# Patient Record
Sex: Female | Born: 1980 | Race: White | Hispanic: No | Smoking: Former smoker
Health system: Southern US, Community
[De-identification: ages and names within clinical notes are randomized; demographics above are authoritative.]

## PROBLEM LIST (undated history)

## (undated) DIAGNOSIS — Z87442 Personal history of urinary calculi: Secondary | ICD-10-CM

## (undated) DIAGNOSIS — M459 Ankylosing spondylitis of unspecified sites in spine: Secondary | ICD-10-CM

## (undated) DIAGNOSIS — D649 Anemia, unspecified: Secondary | ICD-10-CM

## (undated) DIAGNOSIS — K8050 Calculus of bile duct without cholangitis or cholecystitis without obstruction: Secondary | ICD-10-CM

## (undated) DIAGNOSIS — F419 Anxiety disorder, unspecified: Secondary | ICD-10-CM

## (undated) DIAGNOSIS — G971 Other reaction to spinal and lumbar puncture: Secondary | ICD-10-CM

## (undated) DIAGNOSIS — F329 Major depressive disorder, single episode, unspecified: Secondary | ICD-10-CM

## (undated) DIAGNOSIS — F32A Depression, unspecified: Secondary | ICD-10-CM

## (undated) HISTORY — PX: WISDOM TOOTH EXTRACTION: SHX21

---
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
## 2005-12-06 ENCOUNTER — Observation Stay: Payer: Self-pay

## 2005-12-15 ENCOUNTER — Inpatient Hospital Stay: Payer: Self-pay | Admitting: Obstetrics & Gynecology

## 2005-12-18 ENCOUNTER — Observation Stay: Payer: Self-pay

## 2006-12-20 ENCOUNTER — Ambulatory Visit: Payer: Self-pay | Admitting: Gynecologic Oncology

## 2008-02-22 ENCOUNTER — Inpatient Hospital Stay: Payer: Self-pay

## 2008-05-10 ENCOUNTER — Ambulatory Visit: Payer: Self-pay | Admitting: Family Medicine

## 2008-08-02 ENCOUNTER — Ambulatory Visit: Payer: Self-pay | Admitting: Family Medicine

## 2008-08-16 ENCOUNTER — Ambulatory Visit: Payer: Self-pay | Admitting: Internal Medicine

## 2008-11-13 ENCOUNTER — Ambulatory Visit: Payer: Self-pay | Admitting: Otolaryngology

## 2010-06-08 IMAGING — US US THYROID
1 series · 17 of 25 positions shown · non-contrast
Comparison: none

REASON FOR EXAM: goiter f/u
COMMENTS:

[Series 1: us thyroid · 17 of 28 slices shown]
[im 1/28]
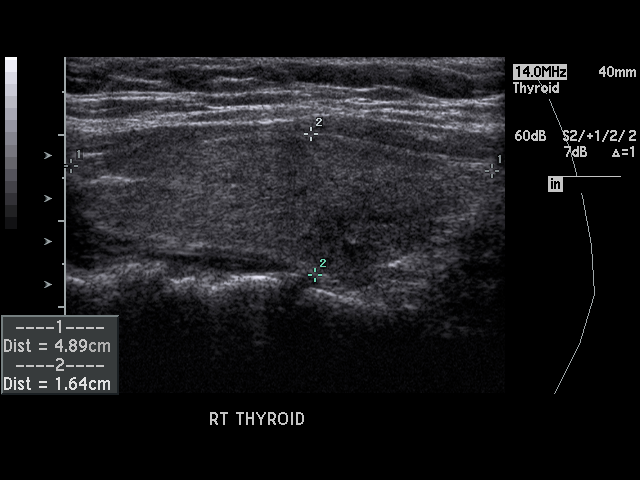
[im 3/28]
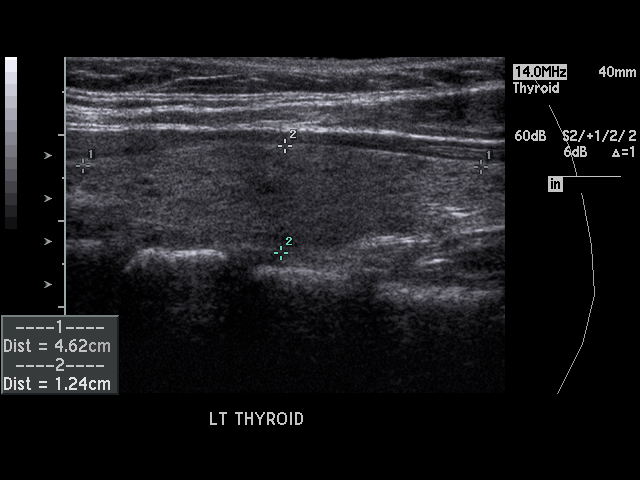
[im 4/28]
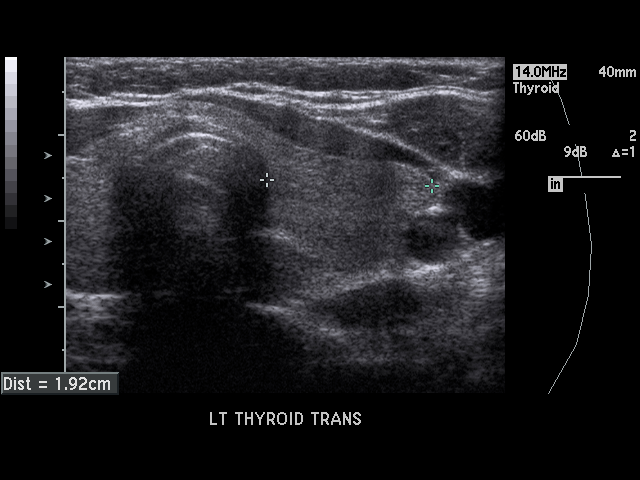
[im 6/28]
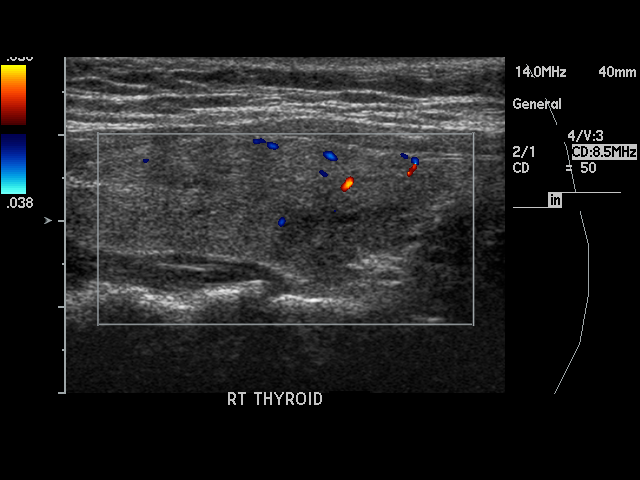
[im 7/28]
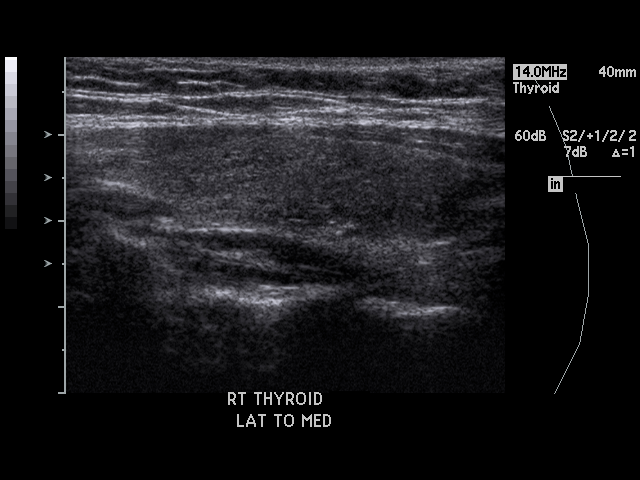
[im 10/28]
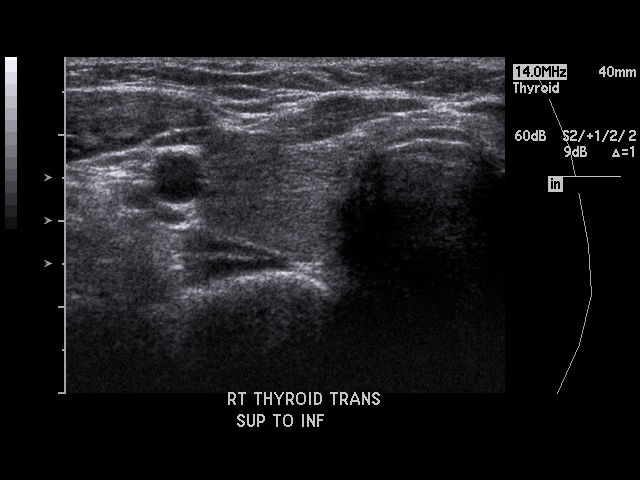
[im 11/28]
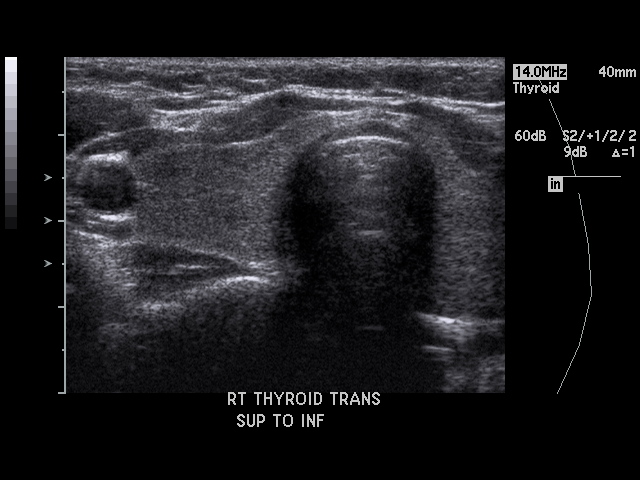
[im 13/28]
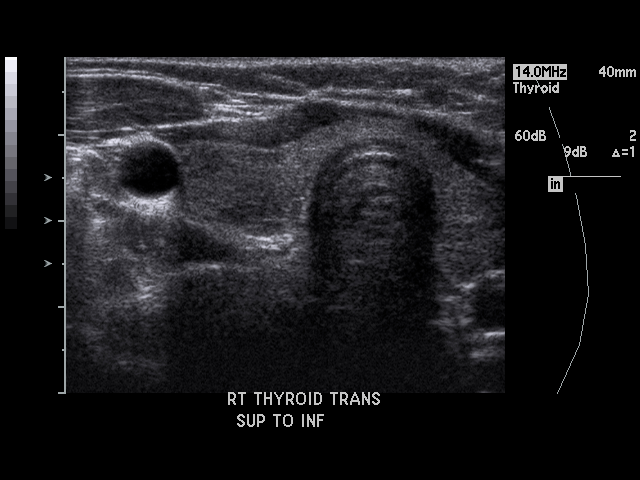
[im 14/28]
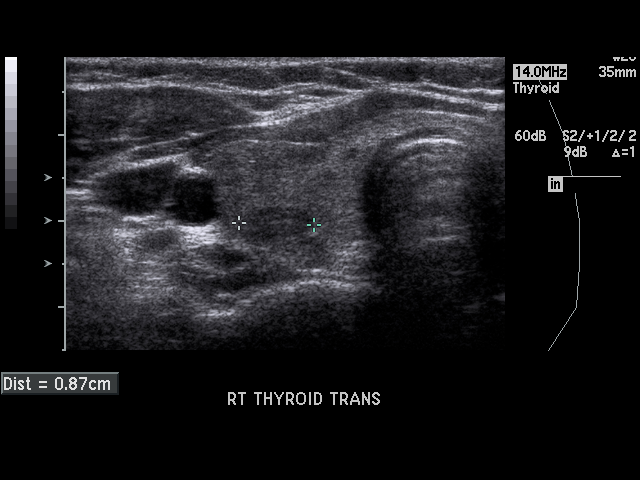
[im 15/28]
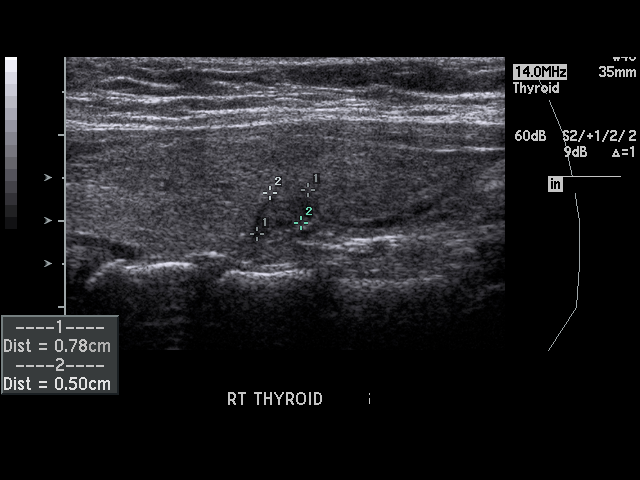
[im 17/28]
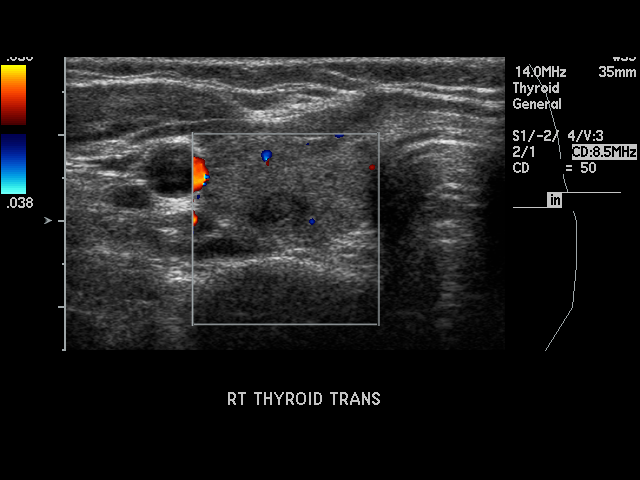
[im 19/28]
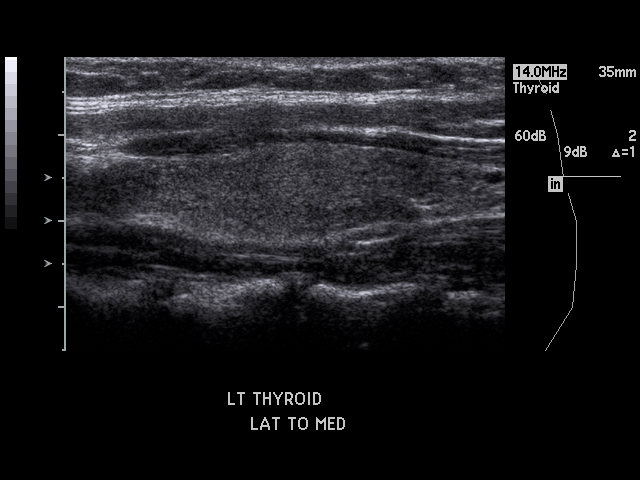
[im 21/28]
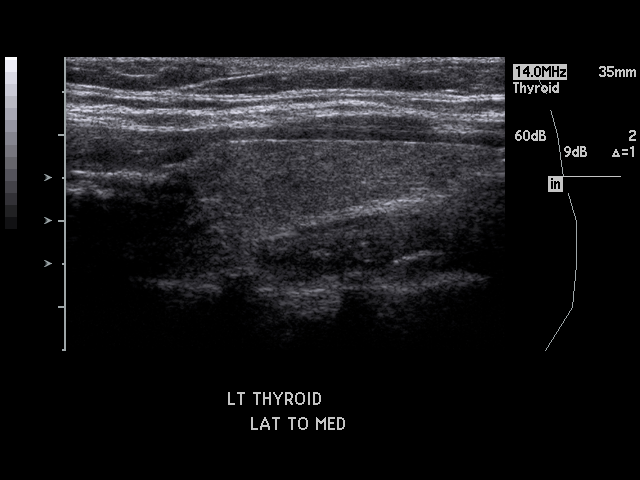
[im 22/28]
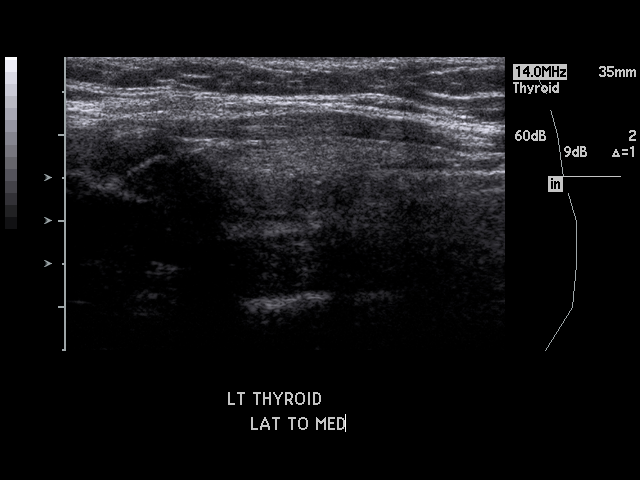
[im 24/28]
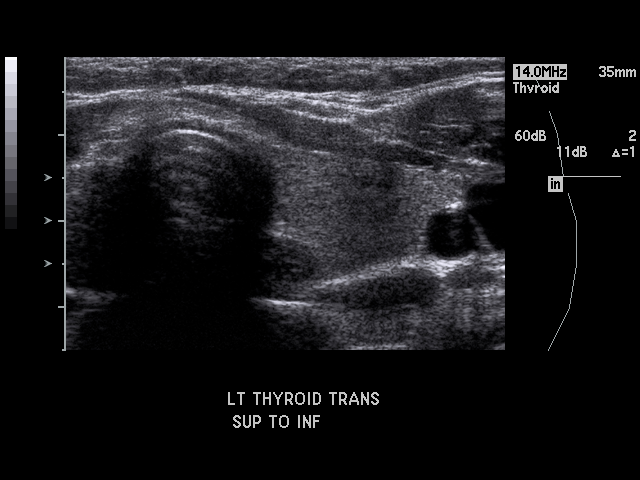
[im 25/28]
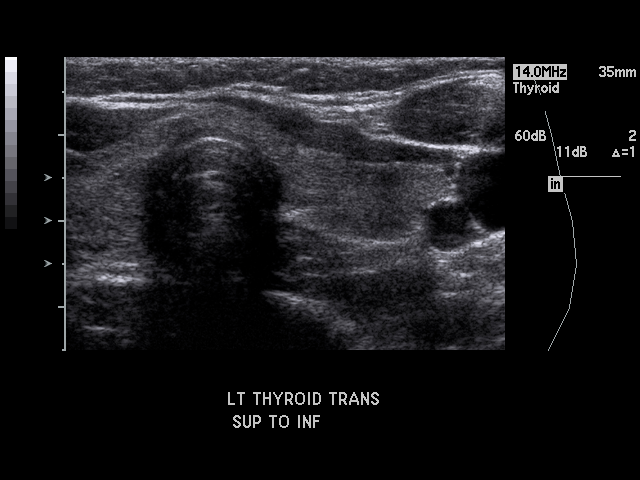
[im 28/28]
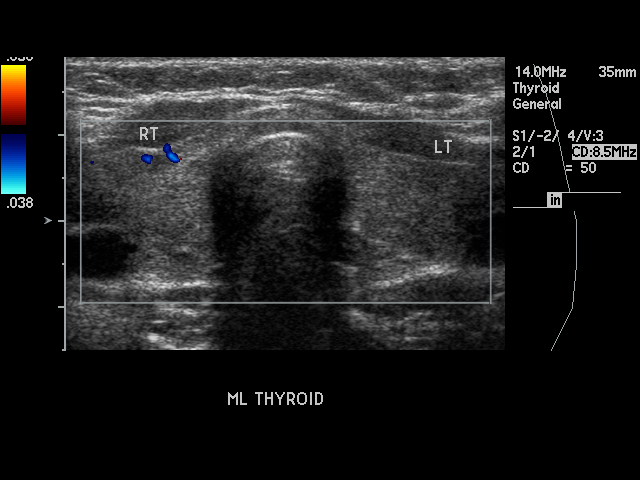

[17 of 25 positions shown; findings below may reference images not displayed]

PROCEDURE:     US  - US THYROID  - August 02, 2008 [DATE]

RESULT:     Ultrasound of the thyroid demonstrates the right lobe measures
4.89 x 1.64 x 1.61 cm. The left lobe measures 4.62 x 1.24 x 1.92 cm. The
right lobe contains a 7.8 x 5.0 x 8.7 mm area of grossly decreased
echogenicity with no calcification or significant shadowing. The left lobe
appears pristine. The isthmus measures 1.61 cm in thickness.
IMPRESSION: Solitary nodular lesion in the right thyroid lobe. There is no evidence of
thyromegaly. No definite microcalcifications are identified. The right lobe
lesion appears to be slightly larger than measured on the previous exam.
Continued followup is suggested in 6 to 12 months.

## 2010-09-29 ENCOUNTER — Ambulatory Visit: Payer: Self-pay

## 2011-06-23 ENCOUNTER — Ambulatory Visit: Payer: Self-pay

## 2012-06-21 HISTORY — PX: GANGLION CYST EXCISION: SHX1691

## 2012-07-28 ENCOUNTER — Ambulatory Visit: Payer: Self-pay | Admitting: General Practice

## 2012-07-31 ENCOUNTER — Emergency Department: Payer: Self-pay | Admitting: Emergency Medicine

## 2012-07-31 LAB — LIPASE, BLOOD: Lipase: 94 U/L (ref 73–393)

## 2012-07-31 LAB — BASIC METABOLIC PANEL
Anion Gap: 9 (ref 7–16)
BUN: 10 mg/dL (ref 7–18)
Creatinine: 0.72 mg/dL (ref 0.60–1.30)
EGFR (African American): >60
EGFR (Non-African Amer.): >60
Glucose: 104 mg/dL — ABNORMAL HIGH (ref 65–99)
Osmolality: 275 (ref 275–301)
Potassium: 4.1 mmol/L (ref 3.5–5.1)
Sodium: 138 mmol/L (ref 136–145)

## 2012-07-31 LAB — URINALYSIS, COMPLETE
Glucose,UR: NEGATIVE mg/dL (ref 0–75)
Nitrite: NEGATIVE
Ph: 5 (ref 4.5–8.0)
Protein: 25
RBC,UR: 2 /HPF (ref 0–5)
Specific Gravity: 1.035 (ref 1.003–1.030)
Squamous Epithelial: 6
WBC UR: 3 /HPF (ref 0–5)

## 2012-07-31 LAB — CBC
HGB: 14.3 g/dL (ref 12.0–16.0)
MCV: 91 fL (ref 80–100)
RBC: 4.64 10*6/uL (ref 3.80–5.20)
RDW: 12.4 % (ref 11.5–14.5)
WBC: 9 10*3/uL (ref 3.6–11.0)

## 2012-07-31 LAB — TROPONIN I: Troponin-I: <0.02 ng/mL

## 2012-08-01 ENCOUNTER — Inpatient Hospital Stay: Payer: Self-pay | Admitting: Internal Medicine

## 2012-08-01 LAB — COMPREHENSIVE METABOLIC PANEL
Albumin: 3.2 g/dL — ABNORMAL LOW (ref 3.4–5.0)
Alkaline Phosphatase: 59 U/L (ref 50–136)
Anion Gap: 9 (ref 7–16)
BUN: 12 mg/dL (ref 7–18)
Bilirubin,Total: 0.4 mg/dL (ref 0.2–1.0)
Calcium, Total: 8.5 mg/dL (ref 8.5–10.1)
Creatinine: 0.67 mg/dL (ref 0.60–1.30)
EGFR (Non-African Amer.): >60
Glucose: 114 mg/dL — ABNORMAL HIGH (ref 65–99)
Potassium: 4.1 mmol/L (ref 3.5–5.1)
SGOT(AST): 22 U/L (ref 15–37)
SGPT (ALT): 22 U/L (ref 12–78)
Total Protein: 7.2 g/dL (ref 6.4–8.2)

## 2012-08-01 LAB — CBC
MCHC: 33.5 g/dL (ref 32.0–36.0)
MCV: 91 fL (ref 80–100)
RBC: 4.43 10*6/uL (ref 3.80–5.20)
RDW: 12.5 % (ref 11.5–14.5)
WBC: 12.8 10*3/uL — ABNORMAL HIGH (ref 3.6–11.0)

## 2012-08-01 LAB — HCG, QUANTITATIVE, PREGNANCY: Beta Hcg, Quant.: <1 m[IU]/mL — ABNORMAL LOW

## 2012-08-01 LAB — CLOSTRIDIUM DIFFICILE BY PCR

## 2012-08-02 LAB — BASIC METABOLIC PANEL
BUN: 8 mg/dL (ref 7–18)
Chloride: 109 mmol/L — ABNORMAL HIGH (ref 98–107)
Co2: 22 mmol/L (ref 21–32)
Creatinine: 0.63 mg/dL (ref 0.60–1.30)
EGFR (African American): >60
EGFR (Non-African Amer.): >60
Glucose: 96 mg/dL (ref 65–99)
Osmolality: 276 (ref 275–301)
Sodium: 139 mmol/L (ref 136–145)

## 2012-08-02 LAB — CBC WITH DIFFERENTIAL/PLATELET
Basophil #: 0 10*3/uL (ref 0.0–0.1)
Basophil %: 0.2 %
Eosinophil #: 0 10*3/uL (ref 0.0–0.7)
Eosinophil %: 0.2 %
HGB: 13.1 g/dL (ref 12.0–16.0)
Lymphocyte #: 1.9 10*3/uL (ref 1.0–3.6)
MCH: 32.9 pg (ref 26.0–34.0)
MCV: 93 fL (ref 80–100)
Monocyte #: 0.7 x10 3/mm (ref 0.2–0.9)
Neutrophil %: 77.6 %
Platelet: 222 10*3/uL (ref 150–440)
RBC: 3.97 10*6/uL (ref 3.80–5.20)
WBC: 11.7 10*3/uL — ABNORMAL HIGH (ref 3.6–11.0)

## 2012-08-02 LAB — MAGNESIUM: Magnesium: 1.6 mg/dL — ABNORMAL LOW

## 2012-08-03 LAB — WBCS, STOOL

## 2012-08-03 LAB — STOOL CULTURE

## 2012-09-06 ENCOUNTER — Ambulatory Visit: Payer: Self-pay | Admitting: Gastroenterology

## 2012-11-28 ENCOUNTER — Ambulatory Visit: Payer: Self-pay

## 2014-10-03 ENCOUNTER — Emergency Department
Admit: 2014-10-03 | Disposition: A | Discharge status: Home / Self Care | Payer: Self-pay | Admitting: Emergency Medicine

## 2014-10-03 LAB — CBC
HCT: 39.2 % (ref 35.0–47.0)
HGB: 12.8 g/dL (ref 12.0–16.0)
MCH: 30.5 pg (ref 26.0–34.0)
MCHC: 32.7 g/dL (ref 32.0–36.0)
MCV: 93 fL (ref 80–100)
PLATELETS: 247 10*3/uL (ref 150–440)
RBC: 4.2 10*6/uL (ref 3.80–5.20)
RDW: 13.2 % (ref 11.5–14.5)
WBC: 10.8 10*3/uL (ref 3.6–11.0)

## 2014-10-04 LAB — URINALYSIS, COMPLETE
BILIRUBIN, UR: NEGATIVE
Glucose,UR: NEGATIVE mg/dL (ref 0–75)
KETONE: NEGATIVE
Leukocyte Esterase: NEGATIVE
Nitrite: NEGATIVE
PROTEIN: NEGATIVE
Ph: 6 (ref 4.5–8.0)
SPECIFIC GRAVITY: 1.005 (ref 1.003–1.030)

## 2014-10-04 LAB — HCG, QUANTITATIVE, PREGNANCY: BETA HCG, QUANT.: 41 m[IU]/mL — AB

## 2014-10-04 LAB — WET PREP, GENITAL

## 2014-10-04 LAB — GC/CHLAMYDIA PROBE AMP

## 2014-10-05 ENCOUNTER — Other Ambulatory Visit
Admit: 2014-10-05 | Disposition: A | Discharge status: Home / Self Care | Payer: Self-pay | Attending: Obstetrics and Gynecology | Admitting: Obstetrics and Gynecology

## 2014-10-05 LAB — HCG, QUANTITATIVE, PREGNANCY: BETA HCG, QUANT.: 21 m[IU]/mL — AB

## 2014-10-11 NOTE — Consult Note (Signed)
PATIENT NAME:  Sheryl Gross, Sheryl Gross MR#:  [id number] DATE OF BIRTH:  [birth year]  DATE OF CONSULTATION:  08/01/2012  PRIMARY CARE PHYSICIAN:  Dr. Beverely RisenFozia Khan.  CONSULTING PHYSICIAN:  Lurline DelShaukat Jacoya Bauman, MD  REASON FOR CONSULTATION:  Abdominal pain, vomiting and diarrhea.   HISTORY OF PRESENT ILLNESS:  A 34 year old female with history of GERD, probable diverticulitis in the past, according to her; history of sciatica, history of kidney stones, seasonal allergies and mild anxiety. According to the patient, she became acutely sick about 3 days ago with diffuse abdominal pain associated with nausea, vomiting and diarrhea. The last time she vomited was several hours ago this morning. She continues to have diarrhea, but with very little stool and mostly fresh-looking blood. She is complaining of diffuse abdominal pain, mostly in the lower abdominal area. Denies any fever or chills. Denies any similar symptoms in the past. She denies any chronic GI symptoms, except for nausea that happens only after she goes to bed at night. Nausea has no relationship to meals, and her appetite is otherwise fair, and she has really no other GI symptoms.   PAST MEDICAL HISTORY: As above.   ALLERGIES: PREVACID, WHICH CAUSES HIVES.   SOCIAL HISTORY: She used to smoke but quit several years ago.   FAMILY HISTORY: Unremarkable.   HOME MEDICATIONS: Include ProAir, Zofran, Flonase.  Recently, she was started on Flagyl and Cipro from the ER, which caused further nausea, according to her; and Tylenol with hydrocodone for pain.   REVIEW OF SYSTEMS: Grossly negative except for what is mentioned in the History of Present Illness.   PHYSICAL EXAMINATION: GENERAL: Well-built female, does not appear to be in distress.  VITAL SIGNS: Temperature 97.9, pulse 68, respirations 18, blood pressure 109/67.  HEENT:  Examination is unremarkable.  NECK:  Veins are flat.  LUNGS:  Grossly clear to auscultation bilaterally with fair air entry. No  added sounds.  CARDIOVASCULAR:  Regular rate and rhythm. No gallops or murmur.  ABDOMEN:  Showed a flat abdomen. Bowel sounds are positive. No significant tenderness was noted on abdominal examination, and there is no rebound or guarding.  NEUROLOGICAL: Examination appears to be unremarkable.   LABORATORY, DIAGNOSTIC AND RADIOLOGICAL DATA:  White cell count is 12.8, hemoglobin 13.5, hematocrit 40.3 and platelet count of 254. Stool for C. diff toxin is negative. Urinalysis is unremarkable, although some amount of protein was noted in the urine. CT scan of the abdomen and pelvis showed abnormal thickening of the wall of the ascending, transverse and part of the descending colon. Bilateral ovarian cysts, otherwise unremarkable.   ASSESSMENT AND PLAN:  The patient with acute onset nausea, vomiting, abdominal pain, diarrhea with blood with an abnormal CT suggestive of colitis. Most likely we are dealing with a bacterial colitis, although inflammatory bowel disease is possible but less likely. The patient'Gross hemoglobin and hematocrit is normal, and the blood in the stool is either related to colitis or most likely secondary to hemorrhoids. She is afebrile, and does not appear to be toxic or septic. I agree with the current choice of antibiotics. Clear liquid diet for now. I will follow and make further recommendations. The patient may require a colonoscopy, as well as an EGD after recovering from this acute infectious process. We will follow. Further recommendations to follow.    ____________________________ Lurline DelShaukat Beau Vanduzer, MD si:dm D: 08/01/2012 18:17:00 ET T: 08/01/2012 20:34:44 ET JOB#: [id number]  cc: Lurline DelShaukat Williard Keller, MD, <Dictator> Lurline DelSHAUKAT Makari Sanko MD ELECTRONICALLY SIGNED 08/04/2012 11:10

## 2014-10-11 NOTE — Consult Note (Signed)
Chief Complaint:  Subjective/Chief Complaint Feels much better. No diarrhea or bleeding since last night. No fever.   VITAL SIGNS/ANCILLARY NOTES: **Vital Signs.:   13-Feb-14 13:22  Vital Signs Type Routine  Temperature Temperature (F) 98.9  Celsius 37.1  Temperature Source oral  Pulse Pulse 71  Respirations Respirations 18  Systolic BP Systolic BP 97  Diastolic BP (mmHg) Diastolic BP (mmHg) 64  Mean BP 75  Pulse Ox % Pulse Ox % 96  Pulse Ox Activity Level  At rest  Oxygen Delivery Room Air/ 21 %   Brief Assessment:  Additional Physical Exam Abdomen is soft and benign. Non tender.   Lab Results: Routine Chem:  12-Feb-14 04:03   Glucose, Serum 96  BUN 8  Creatinine (comp) 0.63  Sodium, Serum 139  Potassium, Serum 4.0  Chloride, Serum  109  CO2, Serum 22  Calcium (Total), Serum  8.0  Anion Gap 8  Osmolality (calc) 276  eGFR (African American) >60  eGFR (Non-African American) >60 (eGFR values <64mL/min/1.73 m2 may be an indication of chronic kidney disease (CKD). Calculated eGFR is useful in patients with stable renal function. The eGFR calculation will not be reliable in acutely ill patients when serum creatinine is changing rapidly. It is not useful in  patients on dialysis. The eGFR calculation may not be applicable to patients at the low and high extremes of body sizes, pregnant women, and vegetarians.)  Magnesium, Serum  1.6 (1.8-2.4 THERAPEUTIC RANGE: 4-7 mg/dL TOXIC: > 10 mg/dL  -----------------------)  Routine Hem:  12-Feb-14 04:03   WBC (CBC)  11.7  RBC (CBC) 3.97  Hemoglobin (CBC) 13.1  Hematocrit (CBC) 37.0  Platelet Count (CBC) 222  MCV 93  MCH 32.9  MCHC 35.3  RDW 12.5  Neutrophil % 77.6  Lymphocyte % 16.1  Monocyte % 5.9  Eosinophil % 0.2  Basophil % 0.2  Neutrophil #  9.1  Lymphocyte # 1.9  Monocyte # 0.7  Eosinophil # 0.0  Basophil # 0.0 (Result(s) reported on 02 Aug 2012 at 05:08AM.)   Assessment/Plan:  Assessment/Plan:   Assessment Colitis, clinically much better.   Plan Full liquids, advance as tolerated. Probable DC home tomorrow. Continue PO antibiotics for a total of 10-14 days. Follow up with me in 2 weeks (orders written). Will sign off. Please call me if needed. Thanks.   Electronic Signatures: Jill Side (MD)  (Signed 13-Feb-14 13:50)  Authored: Chief Complaint, VITAL SIGNS/ANCILLARY NOTES, Brief Assessment, Lab Results, Assessment/Plan   Last Updated: 13-Feb-14 13:50 by Jill Side (MD)

## 2014-10-11 NOTE — H&P (Signed)
PATIENT NAME:  Sheryl Gross, CROTTEAU MR#:  [id number] DATE OF BIRTH:  August 04, 1980  DATE OF ADMISSION:  08/01/2012  CHIEF COMPLAINT: Abdominal pain, nausea, vomiting and bloody diarrhea.   PRIMARY CARE PHYSICIAN: Lyndon Code, MD  REFERRING PHYSICIAN: Cecille Amsterdam. Mayford Knife, MD  HISTORY OF PRESENT ILLNESS: The patient is a very nice 34 year old female who has a history of GERD, diverticulitis, seasonal allergies, mild anxiety, sciatica, previous history of kidney stones, a thyroid nodule followed by Dr. Welton Flakes and occasional migraines. The patient comes today after being seen here last night with the complaint of continuous diarrhea, nausea and abdominal pain.   Apparently the patient started having symptoms last Saturday, 4 days ago, that started with belly cramping located in the mid part of the abdomen. The pain did not go away but she was able to tolerate it. On Sunday, she started having vomiting and diarrhea. She says that on Saturday she vomited over 10 times. Since then, she has been vomiting probably 5 times a day. The vomiting is pretty much clear, just gastric contents, no blood. The patient has significant diarrhea. She states that she has been having bowel movements about once every hour. She has been up all night. She presented yesterday to the ER where she was evaluated. She was prescribed ciprofloxacin and metronidazole orally. She was sent home.   This morning, she comes with the complaint of continuation of nausea and vomiting. She says that she has not been able to eat anything since Saturday, not able to keep anything down. She is able to drink a little bit of liquids, but most of them she has to vomit back again. Her bowel movements are liquid with bloody stool, no mucous or green stools. The patient states that it hurts every time that she has a bowel movement, mostly cramping. The pain is located in the mesogastrium and suprapubic area. It is worse down to the left lower quadrant. It is  crampy in nature. It is very intense, sometimes 9 to 10/10. There is no radiation to the back. She also has some mild epigastric pain. Overall, the patient is not able to be discharged due to the fact that she is not keeping up with her liquids and she is significantly dehydrated. We were called to admit the patient for medical management.   REVIEW OF SYSTEMS:    CONSTITUTIONAL: The patient denies any fever, but she states that she has a lot of chills and yesterday she felt warm, but it was not quantified. She feels really weak from not eating. She denies any significant weight loss or weight gain in the last couple of months.  EYES: No blurry vision. No double vision. No inflammation.  ENT: No tinnitus. No difficulty swallowing. No snoring.  RESPIRATORY: The patient has a history of allergies and uses an inhaler because occasionally has wheezing whenever she has allergies, but not recently. Denies any cough, wheezing, hemoptysis, dyspnea at this moment.  CARDIOVASCULAR: No chest pain, no orthopnea, no edema, no arrhythmia. Previous history of syncope which was related to vasovagal event, and she at some point was told that she had  bradycardia but at this moment, she is asymptomatic and her heart rate is in the upper 60s.  GASTROINTESTINAL: Positive nausea. Positive vomiting. Positive diarrhea. Positive abdominal pain. Positive hematochezia. No hematemesis. No melena. No jaundice. No hernia. Positive GERD.  GENITOURINARY: No dysuria, hematuria. Previous history of kidney stones, but not active at this moment. GYNECOLOGIC: The patient is on birth control.  She had 2 vaginal deliveries. No breast pathology.  ENDOCRINE: No polyuria, polydipsia, polyphagia. No cold or heat intolerance. She does have a thyroid nodule, which is being followed by Dr. Beverely Risen. No surgical management. No medical management for that so far. HEMATOLOGIC: No anemia. No easy bleeding. No swollen glands.  MUSCULOSKELETAL: No  significant pain in joints, swelling or gout.  NEUROLOGIC: No numbness, tingling. No ataxia. No CVAs. No TIAs. PSYCHIATRIC: Positive history of anxiety. No insomnia. No significant depression.   PAST MEDICAL HISTORY:  1.  History of diverticulitis.  2.  GERD.  3.  Seasonal allergies.  4.  History of thyroid nodule.  5.  Sciatica.  6.  History of nephrolithiasis.  7.  Anxiety.  8.  Occasional migraines.   ALLERGIES: THE PATIENT IS ALLERGIC TO PREVACID, GAVE HER HIVES.   PAST SURGICAL HISTORY:  1.  Wrist cyst removed by Dr. Ernest Pine a week ago.  2.  Wisdom teeth surgery.   SOCIAL HISTORY: The patient used to smoke. She smoked for over 6 years, but she quit over 5 years ago. She used to smoke less than 1/2 pack a day. Alcohol: She says that she drinks very seldomly, not even once a week. She lives with her husband and 2 kids. She has been pregnant 3 times, had a miscarriage. 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   FAMILY HISTORY: Grandmother had dementia and CHF, and her mom has arrhythmia and atrial fibrillation. No history of cancer in the family.   MEDICATIONS: Include ProAir HFA 2 puffs 4 times a day as needed, Zofran p.r.n., Microgestin 1/20 once a day, Flonase 50 mcg nasal spray, Flagyl was prescribed yesterday 500 mg 2 times a day, EpiPen as needed for allergies, Clarinex 5 mg once a day, ciprofloxacin 1 every 12 hours, Bontril PDM 35 mg 5 times a week and Tylenol with hydrocodone as needed for pain.   PHYSICAL EXAMINATION:  VITAL SIGNS: Blood pressure 143/64, pulse 64, respiratory rate 18, temperature 98.2.  GENERAL: The patient is alert, oriented x 3, in no acute distress. No respiratory distress. She is hemodynamically stable.  HEENT: Her pupils are equal and reactive. Extraocular movements are intact. Mucosa are dry. Anicteric sclerae. Pink conjunctivae. No oral lesions. No oropharyngeal exudates.  NECK: Supple. No JVD. No thyromegaly. No adenopathy. No  masses. No carotid bruits.  CARDIOVASCULAR: Regular rate and rhythm. No murmurs, rubs or gallops.  LUNGS: Clear without any wheezing or crepitus. No use of accessory muscles.  CARDIOVASCULAR: No displacement of PMI. No tenderness to palpation to anterior chest.  ABDOMEN: Soft, but is tender to palpation along the suprapubic area and left lower quadrant for the most, although the patient states that it hurts all over. The epigastric area is also a little bit tender. There is no rebound. No organomegaly. No masses. Bowel sounds are positive and normal at this moment.  GENITAL: Deferred.  EXTREMITIES: No edema, no cyanosis, no clubbing. Pulses +2. Capillary refill less than 3.  NEUROLOGIC: Cranial nerves II through XII are intact. Strength is equal in all 4 extremities, 5/5.  PSYCHIATRIC: Mood is normal without any signs of agitation. She is alert, oriented x 3.  SKIN: Without any rashes or petechiae. Turgor is normal.  LYMPHATIC: Negative for lymphadenopathy in the neck or supraclavicular areas.  MUSCULOSKELETAL: No significant joint deformity or joint effusions.   LABORATORY AND RADIOLOGICAL DATA: Her CT of the abdomen showed finding consistent with colitis. The colon appears to be  nondistended, but the wall appears to be edematous in the ascending colon, transverse colon, through the splenic flexure and the proximal ascending colon. The distal colon appears to be less abnormal. She also has bilateral cysts on the ovaries that look physiologic. Her hemoglobin is 13.5. Her white count is 12.8. Her albumin is 3.2. Her glucose is 114, potassium 4.1, sodium 137. Pregnancy test is negative. Urine sample shows 3 white blood cells, 2 red blood cells, negative nitrite and leukocyte esterase.   ASSESSMENT AND PLAN: This 34 year old female with history of allergies, diverticulitis in the past, gastroesophageal reflux disease, occasional migraines, comes with a history of 4 days of nausea, vomiting, diarrhea with  bloody stools and abdominal pain, diagnosed with colitis, likely due to infectious.  1.  Colitis: Likely this is bacterial colitis or infectious colitis. The patient has a mildly elevated white count. She has been having chills. No documented fever. No fever at this moment. Does not seem to be any ulcerative colitis or Crohn's since this is the first event and does not have any other history. We are going to go ahead and consult gastroenterology to make sure they help us on the case. The patient also requested to see Dr. Mechele CollinElliott because her mom is a patient of Dr. Mechele CollinElliott, so we are going to ask him kindly to help us in the case. I am going to put her on ciprofloxacin and metronidazole. She does not drink, so the effects of Antabuse are explained to the patient, but at this moment no risk. We are going to work on controlling her pain, her nausea and vomiting and hydration. She looks moderately dehydrated. I am going to put her on normal saline with potassium. Her potassium is okay, it is 4.1, but I want to add on so if the patient has anything within 5 days, and she is losing a significant amount of stool. Her chloride is 106, which is normal on the upper side.  2.  Elevated white blood count: Continue antibiotics. This is likely to be due to infectious colitis.  3.  Gastroesophageal reflux disease: I am going to add on ranitidine every 12 hours.  4.  Allergies: Continue to monitor. The patient can take her home medication as needed and use her inhaler.  5.  Status post wrist cyst removal: No need for any other treatment at this moment.  6.  Deep vein thrombosis prophylaxis: The patient is ambulatory. It is okay for her to continue to take her birth control as far as she gets up and moves.  7.  Other medical problems are stable.  8.  Gastrointestinal prophylaxis: H2-blockers.   CODE STATUS: The patient is a full code.   TIME SPENT: I spent about 45 minutes with this admission.     ____________________________ Felipa Furnaceoberto Sanchez Gutierrez, MD rsg:jm D: 08/01/2012 13:04:49 ET T: 08/01/2012 14:47:20 ET JOB#: [id number]  cc: Felipa Furnaceoberto Sanchez Gutierrez, MD, <Dictator> Kyre Jeffries Juanda ChanceSANCHEZ GUTIERRE MD ELECTRONICALLY SIGNED 08/09/2012 14:17

## 2014-10-11 NOTE — Op Note (Signed)
PATIENT NAME:  Sheryl Gross, Artisha S MR#:  [id number] DATE OF BIRTH:  21-Oct-1980  DATE OF PROCEDURE:  07/28/2012  PREOPERATIVE DIAGNOSIS: Ganglion cyst to the dorsum of the right wrist.   POSTOPERATIVE DIAGNOSIS: Ganglion cyst to the dorsum of the right wrist (pathology pending).  PROCEDURE PERFORMED: Excision of ganglion cyst from the dorsum of the right wrist.   SURGEON: Illene LabradorJames P. Hooten, MD  ANESTHESIA: General.   ESTIMATED BLOOD LOSS: Minimal.   TOURNIQUET TIME: 25 minutes.   SPECIMEN: Ganglion cyst from the dorsum of the right wrist.   INDICATIONS FOR SURGERY: The patient is a 34 year old left-hand dominant female who has had a several year history of a cystic lesion of the dorsum of the right wrist. She has had some increase discomfort in the area as well as some slight increase in size. The findings were consistent with ganglion cyst to the dorsum of the right wrist. After discussion of the risks and benefits of surgical intervention, the patient expressed understanding of the risks and benefits and agreed with plans for surgical intervention.   PROCEDURE IN DETAIL: The patient was brought to the Operating Room and, after adequate general anesthesia was achieved, a tourniquet was placed on the patient's right upper arm. The patient's right hand and arm were cleaned and prepped with alcohol and DuraPrep draped in the usual sterile fashion. A "time-out" performed as per usual protocol. The right upper extremity was exsanguinated using an Esmarch, the tourniquet was inflated to 250 mmHg. Loupe magnification was used throughout the procedure. A transverse incision was made across the level of the cystic lesion on the dorsum of the wrist. Careful dissection was carried out using tenotomy scissors and a multiloculated cystic lesion was encountered. Dissection was carried down around the margins of the lesion. There appeared to be continuity with the tendon sheath. The tendons were visualized and were  intact without any damage. The dissection was carried down around the undersurface of the lesion and the multiloculated cyst was excised and submitted to Pathology. The initial size of the lesion was approximately 1.5 cm x 1.5 cm. The wound was irrigated with copious amounts of normal saline with antibiotic solution. Number 4-0 Vicryl was used for closure of deeper tissues. The subcutaneous tissue was reapproximated using interrupted sutures of 4-0 Vicryl. Steri-Strips were applied. Then 3 mL of 0.25% Marcaine was injected along the incision site. A sterile dressing was applied. The tourniquet was deflated after a total tourniquet time of 25 minutes.   The patient tolerated procedure well. She was transported to the Recovery Room in stable condition.   ____________________________ Illene LabradorJames P. Angie FavaHooten Jr., MD [email or web address] D: 07/28/2012 09:04:00 ET T: 07/28/2012 10:55:30 ET JOB#: [id number]  cc: Fayrene FearingJames P. Angie FavaHooten Jr., MD, <Dictator> Illene LabradorJAMES P Angie FavaHOOTEN JR MD ELECTRONICALLY SIGNED 07/28/2012 17:45

## 2014-10-11 NOTE — Consult Note (Signed)
Brief Consult Note: Diagnosis: Colitis.   Patient was seen by consultant.   Consult note dictated.   Comments: Patinet with what appears to be extensive colitis, probably infectious. C. diff toxin negative. Chronic nausea.  Recommendations: Agree with current antibiotics. Clear liquid diet. Further recommendations to follow.  Electronic Signatures: Lurline DelIftikhar, Shaivi Rothschild (MD)  (Signed 11-Feb-14 18:12)  Authored: Brief Consult Note   Last Updated: 11-Feb-14 18:12 by Lurline DelIftikhar, Cleon Thoma (MD)

## 2014-10-11 NOTE — Discharge Summary (Signed)
PATIENT NAME:  Sheryl Gross, Sheryl Gross MR#:  [id number] DATE OF BIRTH:  1980/10/31  DATE OF ADMISSION:  08/01/2012 DATE OF DISCHARGE:  08/04/2012  DISCHARGE DIAGNOSES:   1.  Infective colitis. 2.  Elevated white cell count.   HISTORY OF PRESENT ILLNESS: The patient is a 34 year old female with history of gastroesophageal reflux disease, diverticulitis, seasonal allergies, mild anxiety, sciatica, previous history of kidney stones and thyroid nodule followed by Dr. Welton FlakesKhan and occasional migraines. The patient was in the ER for complaint of diarrhea, nausea and abdominal pain. These symptoms started 4 days ago with cramping pain in the abdomen and she started vomiting. There was no blood in the vomitus. She was not able to eat or drink anything because of the severe vomiting and nausea.  The pain was very intense, sometimes 9 to 10 out of 10.   It was non-radiating.    HOSPITAL COURSE AND STAY:   The patient was admitted for colitis and on admission a CT scan of the abdomen and pelvis was done, which showed finding consistent with colitis, bilateral ovarian cysts, some free fluid in the pelvis may be physiologic. GI consult with Dr. Niel HummerIftikhar was done and he suggested that continue the antibiotics and current management, clear liquid diet and pain management.  She was started on ciprofloxacin, metronidazole, pain management and IV fluids.   She felt gradually but significantly better over the next 2 days.  Gastroenterologist, Dr. Niel HummerIftikhar, followed her and he suggested that he will do colonoscopy, but he would like to it as an outpatient.  As the patient was feeling significantly better, we discharged her with oral antibiotics to be finished and advised to go slowly up on her diet.    Other medical issues addressed during the hospital stay: Elevated white cell count, which was due to colitis. We continued antibiotics.   Allergies:  She was not in any acute distress. We continued her home medication and sprays as she  was taking at home.   Status post wrist cyst which was done recently outside and she was having the bandage. We continued in the hospital and this was not an active issue.   IMPORTANT LABORATORY AND DIAGNOSTIC DATA:  Lipase was 94 on admission.  C. dif was negative. Urinalysis grossly negative. Pregnancy test negative.   CONDITION ON DISCHARGE: Stable.   CODE STATUS: Full code.   MEDICATIONS ADVISED ON DISCHARGE:  Clarinex 5 mg oral tablet once a day, Flonase one spray 2 times a day, Bontril 35 mg oral tablet 5 times a week, Epi EZ Pen one dose intramuscular as needed for allergic reaction, ProAir inhalation aerosol 2 puffs 4 times a day as needed for shortness of breath, micro Gustine FE 20 mcg/1 mg oral tablet once a day, acetaminophen hydrocodone oral tablets every 4 hours as needed for pain, Flagyl 500 mg oral tablet 2 times a day for 7 days, ciprofloxacin 500 mg oral tablet every 12 hours for 7 days, promethazine 12.5 mg oral tablet every 6 hours as needed for nausea, ranitidine 150 mg oral tablet every 12 hours.   DIET ON DISCHARGE:  Regular. Diet consistency regular.   TIMEFRAME TO FOLLOWUP:  Within 1 to 2 weeks with Dr. Lurline DelShaukat Iftikhar, [facility], for follow-up colonoscopy.   Total time spent on discharge: 45 minutes.     ____________________________ Hope PigeonVaibhavkumar G. Elisabeth PigeonVachhani, MD vgv:ct D: 08/06/2012 17:53:30 ET T: 08/07/2012 11:28:01 ET JOB#: [id number]  cc: Hope PigeonVaibhavkumar G. Elisabeth PigeonVachhani, MD, <Dictator> Lurline DelShaukat Iftikhar, MD [facility] LLC[facility]IBHAVKUMAR [facility]  MD ELECTRONICALLY SIGNED 08/11/2012 18:19

## 2014-10-12 ENCOUNTER — Other Ambulatory Visit
Admit: 2014-10-12 | Disposition: A | Discharge status: Home / Self Care | Payer: Self-pay | Attending: Obstetrics and Gynecology | Admitting: Obstetrics and Gynecology

## 2014-10-12 LAB — HCG, QUANTITATIVE, PREGNANCY: Beta Hcg, Quant.: 2 m[IU]/mL

## 2015-03-20 ENCOUNTER — Other Ambulatory Visit: Payer: Self-pay | Admitting: Physician Assistant

## 2015-03-20 DIAGNOSIS — M5441 Lumbago with sciatica, right side: Secondary | ICD-10-CM

## 2015-03-27 ENCOUNTER — Ambulatory Visit
Admission: RE | Admit: 2015-03-27 | Discharge: 2015-03-27 | Disposition: A | Discharge status: Home / Self Care | Payer: BLUE CROSS/BLUE SHIELD | Source: Ambulatory Visit | Attending: Physician Assistant | Admitting: Physician Assistant

## 2015-03-27 DIAGNOSIS — M5441 Lumbago with sciatica, right side: Secondary | ICD-10-CM

## 2015-03-27 DIAGNOSIS — M5126 Other intervertebral disc displacement, lumbar region: Secondary | ICD-10-CM | POA: Insufficient documentation

## 2016-03-03 ENCOUNTER — Observation Stay
Admission: EM | Admit: 2016-03-03 | Discharge: 2016-03-03 | Disposition: A | Discharge status: Home / Self Care | Payer: Medicaid Other | Attending: Obstetrics and Gynecology | Admitting: Obstetrics and Gynecology

## 2016-03-03 DIAGNOSIS — O26893 Other specified pregnancy related conditions, third trimester: Principal | ICD-10-CM | POA: Insufficient documentation

## 2016-03-03 DIAGNOSIS — Z3A35 35 weeks gestation of pregnancy: Secondary | ICD-10-CM | POA: Insufficient documentation

## 2016-03-03 DIAGNOSIS — R51 Headache: Secondary | ICD-10-CM | POA: Insufficient documentation

## 2016-03-03 DIAGNOSIS — O1493 Unspecified pre-eclampsia, third trimester: Secondary | ICD-10-CM | POA: Diagnosis present

## 2016-03-03 LAB — COMPREHENSIVE METABOLIC PANEL
ALT: 13 U/L — ABNORMAL LOW (ref 14–54)
AST: 24 U/L (ref 15–41)
Albumin: 2.7 g/dL — ABNORMAL LOW (ref 3.5–5.0)
Alkaline Phosphatase: 109 U/L (ref 38–126)
Anion gap: 8 (ref 5–15)
BUN: 7 mg/dL (ref 6–20)
CO2: 22 mmol/L (ref 22–32)
Calcium: 9.1 mg/dL (ref 8.9–10.3)
Chloride: 107 mmol/L (ref 101–111)
Creatinine, Ser: 0.58 mg/dL (ref 0.44–1.00)
Glucose, Bld: 90 mg/dL (ref 65–99)
POTASSIUM: 4 mmol/L (ref 3.5–5.1)
Sodium: 137 mmol/L (ref 135–145)
TOTAL PROTEIN: 6.4 g/dL — AB (ref 6.5–8.1)
Total Bilirubin: 0.5 mg/dL (ref 0.3–1.2)

## 2016-03-03 LAB — CBC WITH DIFFERENTIAL/PLATELET
Basophils Absolute: 0 10*3/uL (ref 0–0.1)
Basophils Relative: 0 %
EOS PCT: 1 %
Eosinophils Absolute: 0.1 10*3/uL (ref 0–0.7)
HEMATOCRIT: 37.6 % (ref 35.0–47.0)
Hemoglobin: 12.5 g/dL (ref 12.0–16.0)
LYMPHS ABS: 1.8 10*3/uL (ref 1.0–3.6)
Lymphocytes Relative: 14 %
MCH: 29.9 pg (ref 26.0–34.0)
MCHC: 33.1 g/dL (ref 32.0–36.0)
MCV: 90.4 fL (ref 80.0–100.0)
MONO ABS: 0.7 10*3/uL (ref 0.2–0.9)
Monocytes Relative: 6 %
NEUTROS ABS: 10.2 10*3/uL — AB (ref 1.4–6.5)
Neutrophils Relative %: 79 %
Platelets: 219 10*3/uL (ref 150–440)
RBC: 4.17 MIL/uL (ref 3.80–5.20)
RDW: 15.2 % — AB (ref 11.5–14.5)
WBC: 12.9 10*3/uL — ABNORMAL HIGH (ref 3.6–11.0)

## 2016-03-03 LAB — URIC ACID: Uric Acid, Serum: 4.1 mg/dL (ref 2.3–6.6)

## 2016-03-03 LAB — PROTEIN / CREATININE RATIO, URINE
CREATININE, URINE: 87 mg/dL
Protein Creatinine Ratio: 0.31 mg/mg{Cre} — ABNORMAL HIGH (ref 0.00–0.15)
Total Protein, Urine: 27 mg/dL

## 2016-03-03 MED ORDER — BETAMETHASONE SOD PHOS & ACET 6 (3-3) MG/ML IJ SUSP
12.0000 mg | Freq: Once | INTRAMUSCULAR | Status: AC
  Administered 2016-03-03: 12 mg via INTRAMUSCULAR
  Filled 2016-03-03: qty 2

## 2016-03-03 MED ORDER — BETAMETHASONE SOD PHOS & ACET 6 (3-3) MG/ML IJ SUSP: 12.0000 mg | Freq: Once | INTRAMUSCULAR | Status: DC

## 2016-03-03 NOTE — Plan of Care (Signed)
Spoke to Molson Coors Brewingmeridith s. Regarding lab results. Ok to give pt a snack. Sharyl NimrodMeredith would like to talk to pt before discharging pt home. Reactive monitor strip.

## 2016-03-03 NOTE — OB Triage Note (Signed)
Here for [facility] evaluation. 

## 2016-03-03 NOTE — OB Triage Provider Note (Signed)
History     CSN: [id number]  Arrival date and time: 03/03/16 1236   None     No chief complaint on file.  HPI Sheryl Gross is a 35 yo G4P2 at 35 weeks presenting from the office with elevated BPs 150s/90s, headaches, and worsening blurred vision.  She has chronic headaches, but states over the past week, her headaches have gotten worse to the point where she wakes up from her sleep with a headache.  She has only noticed black spots 1 time in her left eye.  She had proteinuria '578mg'$  in her 24 hour urine.  Discussed with Dr. Leafy Ro in office today and she recommends repeat pre-eclampsia labs/ NST/BMZ  No past medical history on file.  No past surgical history on file.  No family history on file.  Social History  Substance Use Topics  . Smoking status: Not on file  . Smokeless tobacco: Not on file  . Alcohol use Not on file    Allergies:  Allergies  Allergen Reactions  . Prevacid [Lansoprazole] Hives    Prescriptions Prior to Admission  Medication Sig Dispense Refill Last Dose  . cetirizine (ZYRTEC) 10 MG tablet Take 10 mg by mouth daily.   03/03/2016 at Unknown time  . famotidine (PEPCID) 20 MG tablet Take 20 mg by mouth 2 (two) times daily.   03/03/2016 at Unknown time  . ferrous sulfate 325 (65 FE) MG EC tablet Take 325 mg by mouth daily with breakfast.   03/03/2016 at Unknown time  . folic acid (FOLVITE) 1 MG tablet Take 1 mg by mouth daily.   03/03/2016 at Unknown time  . Prenatal Vit-Fe Fumarate-FA (PRENATAL MULTIVITAMIN) TABS tablet Take 1 tablet by mouth daily at 12 noon.   03/03/2016 at Unknown time    Review of Systems  Constitutional: Positive for malaise/fatigue.  Eyes: Positive for blurred vision.  Respiratory: Negative.   Cardiovascular: Negative.   Gastrointestinal: Negative.   Genitourinary: Negative.   Musculoskeletal: Negative.   Skin: Negative.   Neurological: Positive for headaches.  Endo/Heme/Allergies: Negative.   Psychiatric/Behavioral: Negative.    Good fetal movement, +Bh ctxs  Denies VB, LOF, abnormal discharge Physical Exam   Temperature 98.6 F (37 C).  Physical Exam  Constitutional: She appears well-developed and well-nourished.  Cardiovascular: Normal rate and regular rhythm.   Respiratory: Breath sounds normal.  GI: Bowel sounds are normal.  Genitourinary:  Genitourinary Comments: Gravid, non-tender SVE: 1cm/50%/-3/ feels vtx  Fetal Monitoring: Baseline: 130bpm / moderate variability/ +accels/ no decels Toco: every 2-4 minutes   Results for orders placed or performed during the hospital encounter of 03/03/16 (from the past 72 hour(s))  Protein / creatinine ratio, urine     Status: Abnormal   Collection Time: 03/03/16  1:12 PM  Result Value Ref Range   Creatinine, Urine 87 mg/dL   Total Protein, Urine 27 mg/dL    Comment: NO NORMAL RANGE ESTABLISHED FOR THIS TEST   Protein Creatinine Ratio 0.31 (H) 0.00 - 0.15 mg/mg[Cre]  CBC with Differential/Platelet     Status: Abnormal   Collection Time: 03/03/16  1:23 PM  Result Value Ref Range   WBC 12.9 (H) 3.6 - 11.0 K/uL   RBC 4.17 3.80 - 5.20 MIL/uL   Hemoglobin 12.5 12.0 - 16.0 g/dL   HCT 37.6 35.0 - 47.0 %   MCV 90.4 80.0 - 100.0 fL   MCH 29.9 26.0 - 34.0 pg   MCHC 33.1 32.0 - 36.0 g/dL   RDW 15.2 (H) 11.5 -  14.5 %   Platelets 219 150 - 440 K/uL   Neutrophils Relative % 79 %   Neutro Abs 10.2 (H) 1.4 - 6.5 K/uL   Lymphocytes Relative 14 %   Lymphs Abs 1.8 1.0 - 3.6 K/uL   Monocytes Relative 6 %   Monocytes Absolute 0.7 0.2 - 0.9 K/uL   Eosinophils Relative 1 %   Eosinophils Absolute 0.1 0 - 0.7 K/uL   Basophils Relative 0 %   Basophils Absolute 0.0 0 - 0.1 K/uL  Comprehensive metabolic panel     Status: Abnormal   Collection Time: 03/03/16  1:23 PM  Result Value Ref Range   Sodium 137 135 - 145 mmol/L   Potassium 4.0 3.5 - 5.1 mmol/L   Chloride 107 101 - 111 mmol/L   CO2 22 22 - 32 mmol/L   Glucose, Bld 90 65 - 99 mg/dL   BUN 7 6 - 20 mg/dL    Creatinine, Ser 0.58 0.44 - 1.00 mg/dL   Calcium 9.1 8.9 - 10.3 mg/dL   Total Protein 6.4 (L) 6.5 - 8.1 g/dL   Albumin 2.7 (L) 3.5 - 5.0 g/dL   AST 24 15 - 41 U/L   ALT 13 (L) 14 - 54 U/L   Alkaline Phosphatase 109 38 - 126 U/L   Total Bilirubin 0.5 0.3 - 1.2 mg/dL   GFR calc non Af Amer >60 >60 mL/min   GFR calc Af Amer >60 >60 mL/min    Comment: (NOTE) The eGFR has been calculated using the CKD EPI equation. This calculation has not been validated in all clinical situations. eGFR's persistently <60 mL/min signify possible Chronic Kidney Disease.    Anion gap 8 5 - 15  Uric acid     Status: None   Collection Time: 03/03/16  1:23 PM  Result Value Ref Range   Uric Acid, Serum 4.1 2.3 - 6.6 mg/dL     Procedures    Assessment and Plan  1. IUP at 35 weeks  2. R/O pre-eclampsia vs. Gestational HTN    - Known significant proteinuria with elevated P/C ratio today    - pre-eclampsia labs WNL     - BMZ x 1, return tomorrow for next dose    - No headache present now     - Advised to monitor BP at home in the AM and PM 3. Category 1 Fetal Tracing 4. D/C home with strict FKC's and pre-eclampsia precautions reviewed 5. Note given to be out of work throughout the remainder of pregnancy 6. RTC on Friday for Growth [place] and NST  Discussed with Dr. Leafy Ro and she is aware and agrees with plan of care   Darliss Cheney 03/03/2016, 4:06 PM

## 2016-03-03 NOTE — Plan of Care (Signed)
Pt sent in to l/d [facility] about headaches, blurred vision.

## 2016-03-03 NOTE — Discharge Instructions (Signed)

## 2016-03-04 ENCOUNTER — Inpatient Hospital Stay
Admit: 2016-03-04 | Discharge: 2016-03-04 | Disposition: A | Discharge status: Home / Self Care | Payer: Medicaid Other | Attending: Obstetrics and Gynecology | Admitting: Obstetrics and Gynecology

## 2016-03-04 MED ORDER — BETAMETHASONE SOD PHOS & ACET 6 (3-3) MG/ML IJ SUSP
12.0000 mg | Freq: Once | INTRAMUSCULAR | Status: DC
  Filled 2016-03-04: qty 1

## 2016-03-05 NOTE — Final Progress Note (Signed)
CSN: [id number]  Arrival date and time: 03/03/16 1236   None     No chief complaint on file.  HPI Sheryl Gross is a 35 yo G4P2 at 35 weeks presenting from the office with elevated BPs 150s/90s, headaches, and worsening blurred vision.  She has chronic headaches, but states over the past week, her headaches have gotten worse to the point where she wakes up from her sleep with a headache.  She has only noticed black spots 1 time in her left eye.  She had proteinuria '578mg'$  in her 24 hour urine.  Discussed with Dr. Leafy Ro in office today and she recommends repeat pre-eclampsia labs/ NST/BMZ  No past medical history on file.  No past surgical history on file.  No family history on file.      Social History  Substance Use Topics  . Smoking status: Not on file  . Smokeless tobacco: Not on file  . Alcohol use Not on file    Allergies:      Allergies  Allergen Reactions  . Prevacid [Lansoprazole] Hives           Prescriptions Prior to Admission  Medication Sig Dispense Refill Last Dose  . cetirizine (ZYRTEC) 10 MG tablet Take 10 mg by mouth daily.   03/03/2016 at Unknown time  . famotidine (PEPCID) 20 MG tablet Take 20 mg by mouth 2 (two) times daily.   03/03/2016 at Unknown time  . ferrous sulfate 325 (65 FE) MG EC tablet Take 325 mg by mouth daily with breakfast.   03/03/2016 at Unknown time  . folic acid (FOLVITE) 1 MG tablet Take 1 mg by mouth daily.   03/03/2016 at Unknown time  . Prenatal Vit-Fe Fumarate-FA (PRENATAL MULTIVITAMIN) TABS tablet Take 1 tablet by mouth daily at 12 noon.   03/03/2016 at Unknown time    Review of Systems  Constitutional: Positive for malaise/fatigue.  Eyes: Positive for blurred vision.  Respiratory: Negative.   Cardiovascular: Negative.   Gastrointestinal: Negative.   Genitourinary: Negative.   Musculoskeletal: Negative.   Skin: Negative.   Neurological: Positive for headaches.  Endo/Heme/Allergies: Negative.    Psychiatric/Behavioral: Negative.   Good fetal movement, +Bh ctxs  Denies VB, LOF, abnormal discharge Physical Exam   Temperature 98.6 F (37 C).  Physical Exam  Constitutional: She appears well-developed and well-nourished.  Cardiovascular: Normal rate and regular rhythm.   Respiratory: Breath sounds normal.  GI: Bowel sounds are normal.  Genitourinary:  Genitourinary Comments: Gravid, non-tender SVE: 1cm/50%/-3/ feels vtx  Fetal Monitoring: Baseline: 130bpm / moderate variability/ +accels/ no decels Toco: every 2-4 minutes   Lab Results Past 72 Hours        Results for orders placed or performed during the hospital encounter of 03/03/16 (from the past 72 hour(s))  Protein / creatinine ratio, urine     Status: Abnormal   Collection Time: 03/03/16  1:12 PM  Result Value Ref Range   Creatinine, Urine 87 mg/dL   Total Protein, Urine 27 mg/dL    Comment: NO NORMAL RANGE ESTABLISHED FOR THIS TEST   Protein Creatinine Ratio 0.31 (H) 0.00 - 0.15 mg/mg[Cre]  CBC with Differential/Platelet     Status: Abnormal   Collection Time: 03/03/16  1:23 PM  Result Value Ref Range   WBC 12.9 (H) 3.6 - 11.0 K/uL   RBC 4.17 3.80 - 5.20 MIL/uL   Hemoglobin 12.5 12.0 - 16.0 g/dL   HCT 37.6 35.0 - 47.0 %   MCV 90.4 80.0 - 100.0 fL  MCH 29.9 26.0 - 34.0 pg   MCHC 33.1 32.0 - 36.0 g/dL   RDW 15.2 (H) 11.5 - 14.5 %   Platelets 219 150 - 440 K/uL   Neutrophils Relative % 79 %   Neutro Abs 10.2 (H) 1.4 - 6.5 K/uL   Lymphocytes Relative 14 %   Lymphs Abs 1.8 1.0 - 3.6 K/uL   Monocytes Relative 6 %   Monocytes Absolute 0.7 0.2 - 0.9 K/uL   Eosinophils Relative 1 %   Eosinophils Absolute 0.1 0 - 0.7 K/uL   Basophils Relative 0 %   Basophils Absolute 0.0 0 - 0.1 K/uL  Comprehensive metabolic panel     Status: Abnormal   Collection Time: 03/03/16  1:23 PM  Result Value Ref Range   Sodium 137 135 - 145 mmol/L   Potassium 4.0 3.5 - 5.1 mmol/L   Chloride 107 101  - 111 mmol/L   CO2 22 22 - 32 mmol/L   Glucose, Bld 90 65 - 99 mg/dL   BUN 7 6 - 20 mg/dL   Creatinine, Ser 0.58 0.44 - 1.00 mg/dL   Calcium 9.1 8.9 - 10.3 mg/dL   Total Protein 6.4 (L) 6.5 - 8.1 g/dL   Albumin 2.7 (L) 3.5 - 5.0 g/dL   AST 24 15 - 41 U/L   ALT 13 (L) 14 - 54 U/L   Alkaline Phosphatase 109 38 - 126 U/L   Total Bilirubin 0.5 0.3 - 1.2 mg/dL   GFR calc non Af Amer >60 >60 mL/min   GFR calc Af Amer >60 >60 mL/min    Comment: (NOTE) The eGFR has been calculated using the CKD EPI equation. This calculation has not been validated in all clinical situations. eGFR's persistently <60 mL/min signify possible Chronic Kidney Disease.   Anion gap 8 5 - 15  Uric acid     Status: None   Collection Time: 03/03/16  1:23 PM  Result Value Ref Range   Uric Acid, Serum 4.1 2.3 - 6.6 mg/dL       Procedures    Assessment and Plan  1. IUP at 35 weeks  2. R/O pre-eclampsia vs. Gestational HTN    - Known significant proteinuria with elevated P/C ratio today    - pre-eclampsia labs WNL     - BMZ x 1, return tomorrow for next dose    - No headache present now     - Advised to monitor BP at home in the AM and PM 3. Category 1 Fetal Tracing 4. D/C home with strict FKC's and pre-eclampsia precautions reviewed 5. Note given to be out of work throughout the remainder of pregnancy 6. RTC on Friday for Growth [place] and NST  Discussed with Dr. Leafy Ro and she is aware and agrees with plan of care   Darliss Cheney 03/03/2016, 4:06 PM

## 2016-03-19 ENCOUNTER — Inpatient Hospital Stay: Payer: Medicaid Other | Admitting: Anesthesiology

## 2016-03-19 ENCOUNTER — Inpatient Hospital Stay
Admission: RE | Admit: 2016-03-19 | Discharge: 2016-03-22 | DRG: 765 | Disposition: A | Discharge status: Home / Self Care | Payer: Medicaid Other | Attending: Obstetrics & Gynecology | Admitting: Obstetrics & Gynecology

## 2016-03-19 ENCOUNTER — Encounter: Payer: Self-pay | Admitting: Obstetrics & Gynecology

## 2016-03-19 ENCOUNTER — Encounter
Admission: RE | Disposition: A | Discharge status: Home / Self Care | Payer: Self-pay | Source: Home / Self Care | Attending: Obstetrics & Gynecology

## 2016-03-19 DIAGNOSIS — Z87891 Personal history of nicotine dependence: Secondary | ICD-10-CM

## 2016-03-19 DIAGNOSIS — D62 Acute posthemorrhagic anemia: Secondary | ICD-10-CM | POA: Diagnosis not present

## 2016-03-19 DIAGNOSIS — Z6791 Unspecified blood type, Rh negative: Secondary | ICD-10-CM | POA: Diagnosis not present

## 2016-03-19 DIAGNOSIS — O9081 Anemia of the puerperium: Secondary | ICD-10-CM | POA: Diagnosis not present

## 2016-03-19 DIAGNOSIS — O26893 Other specified pregnancy related conditions, third trimester: Secondary | ICD-10-CM | POA: Diagnosis present

## 2016-03-19 DIAGNOSIS — Z3A37 37 weeks gestation of pregnancy: Secondary | ICD-10-CM | POA: Diagnosis not present

## 2016-03-19 DIAGNOSIS — O1494 Unspecified pre-eclampsia, complicating childbirth: Secondary | ICD-10-CM | POA: Diagnosis present

## 2016-03-19 DIAGNOSIS — Z302 Encounter for sterilization: Secondary | ICD-10-CM

## 2016-03-19 DIAGNOSIS — O321XX0 Maternal care for breech presentation, not applicable or unspecified: Secondary | ICD-10-CM | POA: Diagnosis present

## 2016-03-19 HISTORY — DX: Other reaction to spinal and lumbar puncture: G97.1

## 2016-03-19 LAB — COMPREHENSIVE METABOLIC PANEL
ALBUMIN: 2.8 g/dL — AB (ref 3.5–5.0)
ALT: 17 U/L (ref 14–54)
ANION GAP: 7 (ref 5–15)
AST: 24 U/L (ref 15–41)
Alkaline Phosphatase: 126 U/L (ref 38–126)
BUN: 8 mg/dL (ref 6–20)
CHLORIDE: 108 mmol/L (ref 101–111)
CO2: 21 mmol/L — AB (ref 22–32)
Calcium: 9.3 mg/dL (ref 8.9–10.3)
Creatinine, Ser: 0.49 mg/dL (ref 0.44–1.00)
GFR calc non Af Amer: >60 mL/min (ref >60)
GLUCOSE: 84 mg/dL (ref 65–99)
POTASSIUM: 3.8 mmol/L (ref 3.5–5.1)
SODIUM: 136 mmol/L (ref 135–145)
Total Bilirubin: 0.6 mg/dL (ref 0.3–1.2)
Total Protein: 6.4 g/dL — ABNORMAL LOW (ref 6.5–8.1)

## 2016-03-19 LAB — CBC
HCT: 36.5 % (ref 35.0–47.0)
HEMOGLOBIN: 12.4 g/dL (ref 12.0–16.0)
MCH: 30.5 pg (ref 26.0–34.0)
MCHC: 33.9 g/dL (ref 32.0–36.0)
MCV: 89.8 fL (ref 80.0–100.0)
Platelets: 199 10*3/uL (ref 150–440)
RBC: 4.06 MIL/uL (ref 3.80–5.20)
RDW: 16.1 % — ABNORMAL HIGH (ref 11.5–14.5)
WBC: 9.2 10*3/uL (ref 3.6–11.0)

## 2016-03-19 LAB — TYPE AND SCREEN
ABO/RH(D): A NEG
ANTIBODY SCREEN: NEGATIVE

## 2016-03-19 SURGERY — Surgical Case
Anesthesia: Epidural | Laterality: Bilateral | Wound class: Clean Contaminated

## 2016-03-19 MED ORDER — LIDOCAINE HCL (PF) 1 % IJ SOLN: 30.0000 mL | INTRAMUSCULAR | Status: DC | PRN

## 2016-03-19 MED ORDER — WITCH HAZEL-GLYCERIN EX PADS: 1.0000 "application " | MEDICATED_PAD | CUTANEOUS | Status: DC | PRN

## 2016-03-19 MED ORDER — DIBUCAINE 1 % RE OINT: 1.0000 "application " | TOPICAL_OINTMENT | RECTAL | Status: DC | PRN

## 2016-03-19 MED ORDER — SODIUM CHLORIDE 0.9% FLUSH: 3.0000 mL | Freq: Two times a day (BID) | INTRAVENOUS | Status: DC

## 2016-03-19 MED ORDER — DIPHENHYDRAMINE HCL 25 MG PO CAPS: 25.0000 mg | ORAL_CAPSULE | Freq: Four times a day (QID) | ORAL | Status: DC | PRN

## 2016-03-19 MED ORDER — PRENATAL MULTIVITAMIN CH
1.0000 | ORAL_TABLET | Freq: Every day | ORAL | Status: DC
  Administered 2016-03-20 – 2016-03-21 (×2): 1 via ORAL
  Filled 2016-03-19 (×2): qty 1

## 2016-03-19 MED ORDER — ACETAMINOPHEN 500 MG PO TABS
1000.0000 mg | ORAL_TABLET | Freq: Four times a day (QID) | ORAL | Status: DC
Start: 2016-03-19 — End: 2016-03-20
  Administered 2016-03-19 – 2016-03-20 (×2): 1000 mg via ORAL
  Filled 2016-03-19 (×2): qty 2

## 2016-03-19 MED ORDER — OXYCODONE-ACETAMINOPHEN 5-325 MG PO TABS: 2.0000 | ORAL_TABLET | ORAL | Status: DC | PRN

## 2016-03-19 MED ORDER — BUPIVACAINE HCL (PF) 0.25 % IJ SOLN
INTRAMUSCULAR | Status: DC | PRN
  Administered 2016-03-19: 5 mL via EPIDURAL

## 2016-03-19 MED ORDER — LACTATED RINGERS IV SOLN
INTRAVENOUS | Status: DC
  Administered 2016-03-20: 01:00:00 via INTRAVENOUS

## 2016-03-19 MED ORDER — ONDANSETRON HCL 4 MG/2ML IJ SOLN
4.0000 mg | Freq: Four times a day (QID) | INTRAMUSCULAR | Status: DC | PRN
Start: 2016-03-19 — End: 2016-03-19
  Administered 2016-03-19: 8 mg via INTRAVENOUS

## 2016-03-19 MED ORDER — SODIUM CHLORIDE 0.9 % IV SOLN
INTRAVENOUS | Status: DC | PRN
  Administered 2016-03-19: 60 mL

## 2016-03-19 MED ORDER — MENTHOL 3 MG MT LOZG
1.0000 | LOZENGE | OROMUCOSAL | Status: DC | PRN
  Filled 2016-03-19: qty 9

## 2016-03-19 MED ORDER — IBUPROFEN 600 MG PO TABS
600.0000 mg | ORAL_TABLET | Freq: Four times a day (QID) | ORAL | Status: DC
  Administered 2016-03-19 – 2016-03-22 (×12): 600 mg via ORAL
  Filled 2016-03-19 (×12): qty 1

## 2016-03-19 MED ORDER — LIDOCAINE-EPINEPHRINE (PF) 1.5 %-1:200000 IJ SOLN
INTRAMUSCULAR | Status: DC | PRN
  Administered 2016-03-19: 3 mg via PERINEURAL

## 2016-03-19 MED ORDER — SOD CITRATE-CITRIC ACID 500-334 MG/5ML PO SOLN
30.0000 mL | ORAL | Status: DC | PRN
Start: 2016-03-19 — End: 2016-03-19
  Administered 2016-03-19: 30 mL via ORAL

## 2016-03-19 MED ORDER — FENTANYL CITRATE (PF) 100 MCG/2ML IJ SOLN
25.0000 ug | Freq: Once | INTRAMUSCULAR | Status: AC
  Administered 2016-03-19: 25 ug via INTRAVENOUS

## 2016-03-19 MED ORDER — OXYCODONE HCL 5 MG PO TABS
10.0000 mg | ORAL_TABLET | ORAL | Status: DC | PRN
  Administered 2016-03-20 – 2016-03-22 (×12): 10 mg via ORAL
  Filled 2016-03-19 (×12): qty 2

## 2016-03-19 MED ORDER — TERBUTALINE SULFATE 1 MG/ML IJ SOLN
INTRAMUSCULAR | Status: AC
  Filled 2016-03-19: qty 1

## 2016-03-19 MED ORDER — CEFAZOLIN IN D5W 1 GM/50ML IV SOLN
INTRAVENOUS | Status: AC
  Administered 2016-03-19: 1 g
  Filled 2016-03-19: qty 50

## 2016-03-19 MED ORDER — ACETAMINOPHEN 325 MG PO TABS: 650.0000 mg | ORAL_TABLET | ORAL | Status: DC | PRN

## 2016-03-19 MED ORDER — FENTANYL CITRATE (PF) 100 MCG/2ML IJ SOLN
INTRAMUSCULAR | Status: AC
  Administered 2016-03-19: 25 ug via INTRAVENOUS
  Filled 2016-03-19: qty 2

## 2016-03-19 MED ORDER — LACTATED RINGERS IV SOLN
500.0000 mL | INTRAVENOUS | Status: DC | PRN
  Administered 2016-03-19: 500 mL via INTRAVENOUS

## 2016-03-19 MED ORDER — OXYTOCIN 40 UNITS IN LACTATED RINGERS INFUSION - SIMPLE MED
2.5000 [IU]/h | INTRAVENOUS | Status: AC
  Filled 2016-03-19 (×2): qty 1000

## 2016-03-19 MED ORDER — CEFAZOLIN SODIUM-DEXTROSE 2-4 GM/100ML-% IV SOLN
INTRAVENOUS | Status: AC
  Administered 2016-03-19: 2000 mg
  Filled 2016-03-19: qty 100

## 2016-03-19 MED ORDER — ENOXAPARIN SODIUM 40 MG/0.4ML ~~LOC~~ SOLN
40.0000 mg | SUBCUTANEOUS | Status: DC
  Administered 2016-03-20 – 2016-03-22 (×3): 40 mg via SUBCUTANEOUS
  Filled 2016-03-19 (×3): qty 0.4

## 2016-03-19 MED ORDER — SODIUM CHLORIDE 0.9% FLUSH: 3.0000 mL | INTRAVENOUS | Status: DC | PRN

## 2016-03-19 MED ORDER — OXYCODONE-ACETAMINOPHEN 5-325 MG PO TABS: 1.0000 | ORAL_TABLET | ORAL | Status: DC | PRN

## 2016-03-19 MED ORDER — OXYTOCIN 40 UNITS IN LACTATED RINGERS INFUSION - SIMPLE MED
2.5000 [IU]/h | INTRAVENOUS | Status: DC
  Administered 2016-03-19: 890 mL via INTRAVENOUS
  Administered 2016-03-19: 10 mL via INTRAVENOUS
  Filled 2016-03-19: qty 1000

## 2016-03-19 MED ORDER — SODIUM CHLORIDE 0.9 % IJ SOLN
INTRAMUSCULAR | Status: AC
  Filled 2016-03-19: qty 10

## 2016-03-19 MED ORDER — SIMETHICONE 80 MG PO CHEW: 160.0000 mg | CHEWABLE_TABLET | Freq: Four times a day (QID) | ORAL | Status: DC | PRN

## 2016-03-19 MED ORDER — FENTANYL 2.5 MCG/ML W/ROPIVACAINE 0.2% IN NS 100 ML EPIDURAL INFUSION (ARMC-ANES)
EPIDURAL | Status: AC
  Filled 2016-03-19: qty 100

## 2016-03-19 MED ORDER — EPHEDRINE SULFATE-NACL 50-0.9 MG/10ML-% IV SOSY
PREFILLED_SYRINGE | INTRAVENOUS | Status: DC | PRN
  Administered 2016-03-19: 10 mg via INTRAVENOUS

## 2016-03-19 MED ORDER — FENTANYL 2.5 MCG/ML W/ROPIVACAINE 0.2% IN NS 100 ML EPIDURAL INFUSION (ARMC-ANES)
EPIDURAL | Status: DC | PRN
  Administered 2016-03-19: 10 mL/h via EPIDURAL

## 2016-03-19 MED ORDER — BUTORPHANOL TARTRATE 1 MG/ML IJ SOLN: 1.0000 mg | INTRAMUSCULAR | Status: DC | PRN

## 2016-03-19 MED ORDER — BUPIVACAINE HCL 0.25 % IJ SOLN
INTRAMUSCULAR | Status: DC | PRN
  Administered 2016-03-19: 30 mL

## 2016-03-19 MED ORDER — EPHEDRINE SULFATE 50 MG/ML IJ SOLN
INTRAMUSCULAR | Status: AC
  Filled 2016-03-19: qty 1

## 2016-03-19 MED ORDER — LIDOCAINE 2% (20 MG/ML) 5 ML SYRINGE
INTRAMUSCULAR | Status: DC | PRN
  Administered 2016-03-19: 100 mg via INTRAVENOUS
  Administered 2016-03-19: 200 mg via INTRAVENOUS

## 2016-03-19 MED ORDER — LIDOCAINE HCL (PF) 1 % IJ SOLN
INTRAMUSCULAR | Status: DC | PRN
  Administered 2016-03-19: 3 mL

## 2016-03-19 MED ORDER — SODIUM CHLORIDE 0.9 % IV SOLN: 250.0000 mL | INTRAVENOUS | Status: DC

## 2016-03-19 MED ORDER — OXYTOCIN BOLUS FROM INFUSION: 500.0000 mL | Freq: Once | INTRAVENOUS | Status: DC

## 2016-03-19 MED ORDER — FENTANYL CITRATE (PF) 100 MCG/2ML IJ SOLN
INTRAMUSCULAR | Status: DC | PRN
  Administered 2016-03-19 (×2): 50 ug via INTRAVENOUS

## 2016-03-19 MED ORDER — OXYCODONE HCL 5 MG PO TABS
5.0000 mg | ORAL_TABLET | ORAL | Status: DC | PRN
  Administered 2016-03-20: 5 mg via ORAL
  Filled 2016-03-19: qty 1

## 2016-03-19 MED ORDER — COCONUT OIL OIL
1.0000 "application " | TOPICAL_OIL | Status: DC | PRN
  Administered 2016-03-20: 1 via TOPICAL
  Filled 2016-03-19: qty 120

## 2016-03-19 MED ORDER — DEXTROSE 5 % IV SOLN
3.0000 g | INTRAVENOUS | Status: DC
  Filled 2016-03-19: qty 3000

## 2016-03-19 MED ORDER — LACTATED RINGERS IV SOLN
INTRAVENOUS | Status: DC
  Administered 2016-03-19: 15:00:00 via INTRAVENOUS

## 2016-03-19 SURGICAL SUPPLY — 29 items
CANISTER SUCT 3000ML (MISCELLANEOUS) ×2 IMPLANT
CATH KIT ON-Q SILVERSOAK 5IN (CATHETERS) IMPLANT
DRSG TELFA 3X8 NADH (GAUZE/BANDAGES/DRESSINGS) ×2 IMPLANT
ELECT CAUTERY BLADE 6.4 (BLADE) ×2 IMPLANT
ELECT REM PT RETURN 9FT ADLT (ELECTROSURGICAL) ×2
ELECTRODE REM PT RTRN 9FT ADLT (ELECTROSURGICAL) ×1 IMPLANT
GAUZE SPONGE 4X4 12PLY STRL (GAUZE/BANDAGES/DRESSINGS) ×2 IMPLANT
GLOVE BIOGEL PI IND STRL 6.5 (GLOVE) ×1 IMPLANT
GLOVE BIOGEL PI INDICATOR 6.5 (GLOVE) ×1
GLOVE SURG SYN 6.5 ES PF (GLOVE) ×2 IMPLANT
GOWN STRL REUS W/ TWL LRG LVL3 (GOWN DISPOSABLE) ×3 IMPLANT
GOWN STRL REUS W/TWL LRG LVL3 (GOWN DISPOSABLE) ×3
LIQUID BAND (GAUZE/BANDAGES/DRESSINGS) ×2 IMPLANT
NS IRRIG 1000ML POUR BTL (IV SOLUTION) ×2 IMPLANT
PACK C SECTION AR (MISCELLANEOUS) ×2 IMPLANT
PAD OB MATERNITY 4.3X12.25 (PERSONAL CARE ITEMS) ×2 IMPLANT
PAD PREP 24X41 OB/GYN DISP (PERSONAL CARE ITEMS) ×2 IMPLANT
STRAP SAFETY BODY (MISCELLANEOUS) ×2 IMPLANT
STRIP CLOSURE SKIN 1/2X4 (GAUZE/BANDAGES/DRESSINGS) ×2 IMPLANT
SUT MNCRL 4-0 (SUTURE) ×1
SUT MNCRL 4-0 27XMFL (SUTURE) ×1
SUT PDS AB 1 TP1 96 (SUTURE) ×2 IMPLANT
SUT VIC AB 0 CT1 36 (SUTURE) ×4 IMPLANT
SUT VIC AB 2-0 CT1 27 (SUTURE) ×1
SUT VIC AB 2-0 CT1 TAPERPNT 27 (SUTURE) ×1 IMPLANT
SUT VIC AB 3-0 SH 27 (SUTURE) ×1
SUT VIC AB 3-0 SH 27X BRD (SUTURE) ×1 IMPLANT
SUTURE MNCRL 4-0 27XMF (SUTURE) ×1 IMPLANT
SWABSTK COMLB BENZOIN TINCTURE (MISCELLANEOUS) ×2 IMPLANT

## 2016-03-19 NOTE — Discharge Summary (Signed)
Obstetrical Discharge Summary  Patient Name: Sheryl CavalierKimberly L Holroyd DOB: 04/11/1981 MRN: [id number]  Date of Admission: 03/19/2016 Date of Discharge: 03/22/2016  Primary OB: [facility]   Gestational Age at Delivery: 2740w2d   Antepartum complications: Rh negative, obesity, pre-eclampsia, malpresentation Admitting Diagnosis: preeclampsia at term, breech presentation Secondary Diagnosis: Patient Active Problem List   Diagnosis Date Noted  . Labor and delivery, indication for care 03/19/2016  . Pre-eclampsia during pregnancy in third trimester, antepartum 03/03/2016    Augmentation: none Complications: None Intrapartum complications/course: patient was admitted for preeclampsia at term, at 37.2 weeks.  Ultrasound confirmed breech presentation and external cephalic version was attempted both without then with epidural.  This procedure was unsuccessful, so cesarean was called.  Date of Delivery: 03/19/16 Delivered By: Leeroy Bockhelsea Ward Delivery Type: primary cesarean section, low transverse incision with double layer closure (and BTL) Anesthesia: epidural Placenta: sponatneous Laceration: n/a Episiotomy: none Newborn Data: Live born female  Birth Weight: 9 lb 0.3 oz (4090 g) APGAR: 8, 9    Discharge Physical Exam:  BP 136/75 (BP Location: Left Arm)   Pulse 74   Temp 98.7 F (37.1 C)   Resp 18   Ht 5\' 3"  (1.6 m)   Wt 227 lb (103 kg)   LMP 03/09/2015   SpO2 98%   Breastfeeding? Unknown   BMI 40.21 kg/m   General: NAD CV: RRR Pulm: CTABL, nl effort ABD: s/nd/nt, fundus firm and below the umbilicus Lochia: moderate Incision: c/d/i DVT Evaluation: LE non-ttp, no evidence of DVT on exam.  Hemoglobin  Date Value Ref Range Status  03/20/2016 9.9 (L) 12.0 - 16.0 g/dL Final   HGB  Date Value Ref Range Status  10/03/2014 12.8 12.0 - 16.0 g/dL Final   HCT  Date Value Ref Range Status  03/20/2016 29.4 (L) 35.0 - 47.0 % Final  10/03/2014 39.2 35.0 - 47.0 % Final    Post  partum course: uncomplicated Postpartum Procedures: none Disposition: stable, discharge to home. Baby Feeding: breastmilk Baby Disposition: home with mom  Rh Immune globulin given: n/a Rubella vaccine given: n/a Tdap vaccine given in AP or PP setting: AP Flu vaccine given in AP or PP setting: AP  Contraception: BTL  Prenatal Labs:  Blood type/Rh A neg  Antibody screen neg  Rubella Immune  Varicella Immune  RPR NR  HBsAg Neg  HIV NR  GC neg  Chlamydia neg  Genetic screening declined  1 hour GTT 149  3 hour GTT 90/154/130/102  GBS neg  PAP 3/17 neg    Plan:  Sheryl Gross was discharged to home in good condition. [facility] with Dr Elesa MassedWard in 2 weeks   Discharge Medications:   Medication List    TAKE these medications   cetirizine 10 MG tablet Commonly known as:  ZYRTEC Take 10 mg by mouth daily.   docusate sodium 100 MG capsule Commonly known as:  COLACE Take 1 capsule (100 mg total) by mouth daily as needed for mild constipation.   famotidine 20 MG tablet Commonly known as:  PEPCID Take 20 mg by mouth 2 (two) times daily.   ferrous sulfate 325 (65 FE) MG EC tablet Take 325 mg by mouth daily with breakfast.   folic acid 1 MG tablet Commonly known as:  FOLVITE Take 1 mg by mouth daily.   ibuprofen 800 MG tablet Commonly known as:  ADVIL,MOTRIN Take 1 tablet (800 mg total) by mouth every 8 (eight) hours as needed for moderate pain  or cramping.   ondansetron 4 MG disintegrating tablet Commonly known as:  ZOFRAN ODT Take 1 tablet (4 mg total) by mouth every 8 (eight) hours as needed for nausea or vomiting.   oxyCODONE-acetaminophen 5-325 MG tablet Commonly known as:  PERCOCET Take 1-2 tablets by mouth every 6 (six) hours as needed for severe pain.   prenatal multivitamin Tabs tablet Take 1 tablet by mouth daily at 12 noon.         Signed: [name], MD

## 2016-03-19 NOTE — H&P (Signed)
OB History & Physical   History of Present Illness:  Chief Complaint:   HPI:  Sheryl Gross is a 35 y.o. [id number] @ 4137.2  female at Unknown dated by LMP of 07/02/15 with EDC of 04/07/16 confirmed by 6wk ultrasound.  She presents to L&D for external cephalic version due to breech position with intention of induction or cesarean following due to preeclampsia.  She has had intermittent but persistent elevated blood pressures since 31 weeks, and had 24hr urine of 578 on 03/01/16.  She denies HA, visual changes, SOB, or RUQ/epigastric pain.     +FM, no CTX, no LOF, no VB  Pregnancy Issues: 1. Malposition, Breech 2. Preeclampsia without severe features 3. Rh negative 4. History of macrosomic infant  Maternal Medical History:   Past Medical History:  Diagnosis Date  . Spinal headache     Past Surgical History:  Procedure Laterality Date  . GANGLION CYST EXCISION Right 2000   right wrist    Allergies  Allergen Reactions  . Prevacid [Lansoprazole] Hives    Prior to Admission medications   Medication Sig Start Date End Date Taking? Authorizing Provider  cetirizine (ZYRTEC) 10 MG tablet Take 10 mg by mouth daily.    Historical Provider, MD  famotidine (PEPCID) 20 MG tablet Take 20 mg by mouth 2 (two) times daily.    Historical Provider, MD  ferrous sulfate 325 (65 FE) MG EC tablet Take 325 mg by mouth daily with breakfast.    Historical Provider, MD  folic acid (FOLVITE) 1 MG tablet Take 1 mg by mouth daily.    Historical Provider, MD  Prenatal Vit-Fe Fumarate-FA (PRENATAL MULTIVITAMIN) TABS tablet Take 1 tablet by mouth daily at 12 noon.    Historical Provider, MD     [facility]   Social History: She  reports that she has quit smoking. She has quit using smokeless tobacco. She reports that she does not drink alcohol or use drugs.  Family History: family history is not on file.   Review of Systems: A full review of systems was performed and negative  except as noted in the HPI.     Physical Exam:  Vital Signs: BP 113/68   Pulse (!) 112   Temp 98.1 F (36.7 C) (Oral)   Ht 5\' 3"  (1.6 m)   Wt 103 kg (227 lb)   LMP 03/09/2015   Breastfeeding? Unknown   BMI 40.21 kg/m  General: no acute distress.  HEENT: normocephalic, atraumatic Heart: regular rate & rhythm.  No murmurs/rubs/gallops Lungs: clear to auscultation bilaterally, normal respiratory effort Abdomen: soft, gravid, non-tender;  EFW: 8lbs Pelvic:   External: Normal external female genitalia  Cervix: n/a   Extremities: non-tender, symmetric, 1+ edema bilaterally.  DTRs: 2+ Neurologic: Alert & oriented x 3.    Results for orders placed or performed during the hospital encounter of 03/19/16 (from the past 24 hour(s))  CBC     Status: Abnormal   Collection Time: 03/19/16  8:48 AM  Result Value Ref Range   WBC 9.2 3.6 - 11.0 K/uL   RBC 4.06 3.80 - 5.20 MIL/uL   Hemoglobin 12.4 12.0 - 16.0 g/dL   HCT 45.436.5 09.835.0 - 11.947.0 %   MCV 89.8 80.0 - 100.0 fL   MCH 30.5 26.0 - 34.0 pg   MCHC 33.9 32.0 - 36.0 g/dL   RDW 14.716.1 (H) 82.911.5 - 56.214.5 %   Platelets 199 150 - 440 K/uL  Comprehensive metabolic panel  Status: Abnormal   Collection Time: 03/19/16  8:48 AM  Result Value Ref Range   Sodium 136 135 - 145 mmol/L   Potassium 3.8 3.5 - 5.1 mmol/L   Chloride 108 101 - 111 mmol/L   CO2 21 (L) 22 - 32 mmol/L   Glucose, Bld 84 65 - 99 mg/dL   BUN 8 6 - 20 mg/dL   Creatinine, Ser 4.09 0.44 - 1.00 mg/dL   Calcium 9.3 8.9 - 81.1 mg/dL   Total Protein 6.4 (L) 6.5 - 8.1 g/dL   Albumin 2.8 (L) 3.5 - 5.0 g/dL   AST 24 15 - 41 U/L   ALT 17 14 - 54 U/L   Alkaline Phosphatase 126 38 - 126 U/L   Total Bilirubin 0.6 0.3 - 1.2 mg/dL   GFR calc non Af Amer >60 >60 mL/min   GFR calc Af Amer >60 >60 mL/min   Anion gap 7 5 - 15    Pertinent Results:  Prenatal Labs: Blood type/Rh A neg  Antibody screen neg  Rubella Immune  Varicella Immune  RPR NR  HBsAg Neg  HIV NR  GC neg   Chlamydia neg  Genetic screening declined  1 hour GTT 149  3 hour GTT 90/154/130/102  GBS neg  PAP 3/17 neg  FHT: 135 mod + accels no decels TOCO: occasional SVE: n/a    Breech confirmed by ultrasound.  Fetal head at maternal RUQ with limbs on left. Placenta anterior/fundal  Assessment:  Sheryl Gross is a 35 y.o. [id number] @ 37.2 with preeclampsia and breech position.   Plan:  1. Admit to Labor & Delivery 2. CBC, T&S, Clrs, IVF 3. GBS neg 4. Consents obtained. 5. Continuous efm/toco 6. Confirm breech position via ultrasound, then proceed with ECV under epidural.  If successful will induce and if not will proceed with cesarean as indicated by preeclampsia at term. 7. Preeclampsia - no severe features at this time thus not warranted for magnesium sulfate for seizure prophylaxis.  Should this change, will intervene as needed.  ----- Ranae Plumber, MD Attending Obstetrician and Gynecologist [facility], Department of OB/GYN [facility]

## 2016-03-19 NOTE — Procedures (Signed)
Patient presented to L&D with baby in breech position for external cephalic version.  Ultrasound confirmed breech, fetal head in mom's RUQ with spine along right lateral wall and feet in pelvis. AFI from 9/27 was 22cm. Placenta is anterior and fundal.  FHT 140s with accels, moderate variablity, and no decels.  Occasional contractions.   Due to L&D activity, there was a delay in procedure.  Patient agreed to try to let me try without epidural. She was given 50mcg of Fentanyl.  Using water-based lubricant, the caudad end of the fetus was lifted out of the pelvis and pressure was applied to the fetal spine and head to turn the baby clockwise.  Success in verting the fetus from 10:00 to 2:00 however due to intolerance to pain on mom's part, this attempt was terminated.  And epidural was placed and another attempt was tried, with assistance from Dr. Dalbert GarnetBeasley, and again due to maternal discomfort the attempt was terminated.    Fetal hearts remained stable at 140s throughout both attempts.  At this time the version was deemed unsuccessful and mom requested a primary cesarean.  See procedure note.  ----- Sheryl Plumberhelsea Anu Stagner, MD Attending Obstetrician and Gynecologist [facility], Department of OB/GYN [facility]

## 2016-03-19 NOTE — Anesthesia Preprocedure Evaluation (Signed)
Anesthesia Evaluation  Patient identified by MRN, date of birth, ID band  Reviewed: Allergy & Precautions, NPO status , Patient's Chart, lab work & pertinent test results  History of Anesthesia Complications (+) POST - OP SPINAL HEADACHE and history of anesthetic complications  Airway Mallampati: II  TM Distance: <3 FB Neck ROM: full    Dental no notable dental hx.    Pulmonary neg pulmonary ROS, former smoker,    Pulmonary exam normal        Cardiovascular negative cardio ROS Normal cardiovascular exam     Neuro/Psych negative neurological ROS  negative psych ROS   GI/Hepatic negative GI ROS, Neg liver ROS,   Endo/Other  negative endocrine ROS  Renal/GU negative Renal ROS  negative genitourinary   Musculoskeletal negative musculoskeletal ROS (+)   Abdominal   Peds negative pediatric ROS (+)  Hematology negative hematology ROS (+)   Anesthesia Other Findings   Reproductive/Obstetrics negative OB ROS                             Anesthesia Physical Anesthesia Plan  ASA:   Anesthesia Plan: Epidural   Post-op Pain Management:    Induction:   Airway Management Planned:   Additional Equipment:   Intra-op Plan:   Post-operative Plan:   Informed Consent: I have reviewed the patients History and Physical, chart, labs and discussed the procedure including the risks, benefits and alternatives for the proposed anesthesia with the patient or authorized representative who has indicated his/her understanding and acceptance.     Plan Discussed with: Anesthesiologist and CRNA  Anesthesia Plan Comments:         Anesthesia Quick Evaluation

## 2016-03-19 NOTE — Anesthesia Preprocedure Evaluation (Signed)
Anesthesia Evaluation  Patient identified by MRN, date of birth, ID band  Reviewed: Allergy & Precautions, NPO status , Patient's Chart, lab work & pertinent test results  History of Anesthesia Complications (+) POST - OP SPINAL HEADACHE and history of anesthetic complications  Airway Mallampati: II  TM Distance: <3 FB Neck ROM: full    Dental no notable dental hx.    Pulmonary neg pulmonary ROS, former smoker,    Pulmonary exam normal        Cardiovascular negative cardio ROS Normal cardiovascular exam     Neuro/Psych negative neurological ROS  negative psych ROS   GI/Hepatic negative GI ROS, Neg liver ROS,   Endo/Other  negative endocrine ROS  Renal/GU negative Renal ROS  negative genitourinary   Musculoskeletal negative musculoskeletal ROS (+)   Abdominal   Peds negative pediatric ROS (+)  Hematology negative hematology ROS (+)   Anesthesia Other Findings   Reproductive/Obstetrics (+) Pregnancy                             Anesthesia Physical  Anesthesia Plan  ASA: II and emergent  Anesthesia Plan: Epidural   Post-op Pain Management:    Induction:   Airway Management Planned:   Additional Equipment:   Intra-op Plan:   Post-operative Plan:   Informed Consent: I have reviewed the patients History and Physical, chart, labs and discussed the procedure including the risks, benefits and alternatives for the proposed anesthesia with the patient or authorized representative who has indicated his/her understanding and acceptance.     Plan Discussed with: Anesthesiologist and CRNA  Anesthesia Plan Comments:         Anesthesia Quick Evaluation

## 2016-03-19 NOTE — Plan of Care (Signed)
Pt moved to OBS 1 due to patient census requiring pt moved from labor room. MD Dr. Elesa MassedWard aware of situation and the version will be delayed at this time. Ellison Carwin Jahnessa Vanduyn RNC

## 2016-03-19 NOTE — OB Triage Note (Signed)
Scheduled version for breech presentation.  Version will be attempted and depending on outcome patient will either be induced or have a c-section.

## 2016-03-19 NOTE — Op Note (Signed)
Cesarean Section Procedure Note  03/19/2016  Patient:  Sheryl Gross  35 y.o. female at 4928w2d.  Patient's last menstrual period was 03/09/2015. Preoperative diagnosis:  preeclampsia breech Postoperative diagnosis:  preeclampsia breech  PROCEDURE:  Procedure(s) with comments: CESAREAN SECTION WITH BILATERAL TUBAL LIGATION (Bilateral) - has epidural, anesthesia choice BILATERAL TUBAL LIGATION VIA MODIFIED [place] METHOD Surgeon:  Surgeon(s) and Role:    * Adley Castello C Jenna Routzahn, MD - Primary Anesthesia:  epidural I/O: Total I/O In: 900 [I.V.:900] Out: - EBL 500, UOP - 400 Specimens:  Cord Blood, portion of right tube, portion of left tube Complications: None Apparent Disposition:  VS stable to PACU  Findings: normal uterus, tubes and ovaries bilaterally Live born female, complete breech, nuchal cord x2 Birth Weight: 9 lb 0.3 oz (4090 g) APGAR: 8, 9   Indication for procedure: 35 y.o. female at 6328w2d with preeclampsia at term and breech presentation.  She was offered external cephalic version, and one was attempted but was unsuccessful.    Procedure Details   The risks, benefits, complications, treatment options, and expected outcomes were discussed with the patient. Informed consent was obtained. The patient was taken to Operating Room, identified as Sheryl Gross and the procedure verified as a cesarean delivery with bilateral tubal ligation.   After administration of anesthesia, the patient was prepped and draped in the usual sterile manner. A surgical time out was performed, with the pediatric team present. After confirming adequate anesthesia, a Pfannenstiel incision was made and carried down through the subcutaneous tissue to the fascia. Fascial incision was made and extended transversely. The fascia was separated from the underlying rectus tissue superiorly and inferiorly. The peritoneum was identified and entered. Peritoneal incision was extended longitudinally.  A low transverse  uterine incision was made. Delivered from complete breech presentation was a live born female. Delayed cord clamping was performed for 30 seconds. The umbilical cord was doubly clamped and cut, and the baby was handed off to the awaitng pediatrician.  Cord blood was obtained for evaluation. The placenta was removed intact and appeared normal. The uterus was delivered from the abdominal cavity and cleared of clots, membranes, and debris. The uterus, tubes and ovaries appeared normal. The uterine incision was closed with running locking sutures of 0 Vicryl, and then a second, imbricating stitch was placed. Hemostasis was observed.   The attention was turned to the bilateral fallopian tubes.  The tubes were traced to their fimbriated ends, and grasped at the middle of the isthmus.  The mesosalpinx was opened, and suture ligation was placed at the proximal end of the tube.  The fimbriated ends of the tube was divided by placing a kelly clamp in the mesosalpinx, dividing the tissues with metzenbaums,  and a Haney stitch placed and tied down and handed to nursing as portion of left tube and portion of right tube These sites were hemostatic.  The abdominal cavity was evacuated of extraneous fluid. The uterus was returned to the abdominal cavity and again the incision was inspected for hemostasis, which was confirmed.  The paracolic gutters were cleaned. The fascia was then reapproximated with running suture of vicryl. 60cc of Long- and short-acting bupivicaine was injected circumferentially into the fascia.  After a change of gloves, the subcutaneous tissue was irrigated and reapproximated with 3-0 vicryl. The skin was closed with 4-0 Monocryl and 40cc of long- and short-acting bupivacaine injected into the skin and subcutaneous tissues.  The incision was covered with surgical glue.    Instrument,  sponge, and needle counts were correct prior the abdominal closure and at the conclusion of the case.   I was present  and performed this procedure in its entirety.  ----- Ranae Plumber, MD Attending Obstetrician and Gynecologist [facility], Department of OB/GYN [facility]

## 2016-03-19 NOTE — Transfer of Care (Signed)
Immediate Anesthesia Transfer of Care Note  Patient: Stasia CavalierKimberly L Crounse  Procedure(s) Performed: Procedure(s) with comments: CESAREAN SECTION WITH BILATERAL TUBAL LIGATION (Bilateral) - has epidural, anesthesia choice  Patient Location: PACU  Anesthesia Type:Epidural  Level of Consciousness: awake, alert  and oriented  Airway & Oxygen Therapy: Patient Spontanous Breathing  Post-op Assessment: Report given to RN and Post -op Vital signs reviewed and stable  Post vital signs: Reviewed and stable  Last Vitals:  Vitals:   03/19/16 1422 03/19/16 1427                     03/19/16  BP: 117/69 113/68                       124/62  Pulse: 98 (!) 112                         100  Temp:                                        97.39F (ax)    Last Pain:  Vitals:   03/19/16 1305  TempSrc:   PainSc: 0-No pain         Complications: No apparent anesthesia complications

## 2016-03-20 LAB — CBC
HEMATOCRIT: 29.4 % — AB (ref 35.0–47.0)
Hemoglobin: 9.9 g/dL — ABNORMAL LOW (ref 12.0–16.0)
MCH: 30.6 pg (ref 26.0–34.0)
MCHC: 33.7 g/dL (ref 32.0–36.0)
MCV: 90.9 fL (ref 80.0–100.0)
Platelets: 171 10*3/uL (ref 150–440)
RBC: 3.23 MIL/uL — AB (ref 3.80–5.20)
RDW: 16 % — AB (ref 11.5–14.5)
WBC: 12.2 10*3/uL — AB (ref 3.6–11.0)

## 2016-03-20 LAB — RPR: RPR Ser Ql: NONREACTIVE

## 2016-03-20 NOTE — Anesthesia Post-op Follow-up Note (Signed)
  Anesthesia Pain Follow-up Note  Patient: [name]  Day #: 1  Date of Follow-up: 03/20/2016 Time: 9:57 AM  Last Vitals:  Vitals:   03/20/16 0429 03/20/16 0840  BP: (!) 108/59 (!) 114/56  Pulse:  96  Resp: 18 20  Temp: 36.9 C 37 C    Level of Consciousness: alert  Pain: mild   Side Effects:None  Catheter Site Exam:clean, dry  Anti-Coag Meds    Start     Dose/Rate Route Frequency Ordered Stop   03/20/16 0800  enoxaparin (LOVENOX) injection 40 mg     40 mg Subcutaneous Every 24 hours 03/19/16 1908         Plan: D/C from anesthesia care  Lynzi Meulemans

## 2016-03-20 NOTE — Progress Notes (Signed)
Subjective: Postpartum Day 1: Cesarean Delivery Patient reports some incisional pain   Objective: Vital signs in last 24 hours: Temp:  [97.9 F (36.6 C)-98.9 F (37.2 C)] 98.6 F (37 C) (09/30 0840) Pulse Rate:  [80-147] 96 (09/30 0840) Resp:  [8-29] 20 (09/30 0840) BP: (86-128)/(41-97) 114/56 (09/30 0840) SpO2:  [98 %-100 %] 99 % (09/30 0840)  Physical Exam:  General: alert and cooperative Lochia: appropriate Uterine Fundus: firm Incision: healing well DVT Evaluation: No evidence of DVT seen on physical exam.   Recent Labs  03/19/16 0848 03/20/16 0559  HGB 12.4 9.9*  HCT 36.5 29.4*    Assessment/Plan: Status post Cesarean section. Doing well postoperatively.  Continue current care.  SCHERMERHORN,THOMAS 03/20/2016, 9:32 AM

## 2016-03-20 NOTE — Anesthesia Postprocedure Evaluation (Signed)
Anesthesia Post Note  Patient: Sheryl Gross  Procedure(s) Performed: * No procedures listed *  Patient location during evaluation: Mother Baby Anesthesia Type: Epidural Level of consciousness: awake and alert and oriented Pain management: pain level controlled Vital Signs Assessment: post-procedure vital signs reviewed and stable Respiratory status: spontaneous breathing, nonlabored ventilation and respiratory function stable Cardiovascular status: stable Postop Assessment: no headache, no backache and epidural receding Anesthetic complications: no    Last Vitals:  Vitals:   03/20/16 0429 03/20/16 0840  BP: (!) 108/59 (!) 114/56  Pulse:  96  Resp: 18 20  Temp: 36.9 C 37 C    Last Pain:  Vitals:   03/20/16 0840  TempSrc: Oral  PainSc:                  Reverie Vaquera

## 2016-03-20 NOTE — Anesthesia Postprocedure Evaluation (Signed)
Anesthesia Post Note  Patient: Sheryl Gross  Procedure(s) Performed: Procedure(s) (LRB): CESAREAN SECTION WITH BILATERAL TUBAL LIGATION (Bilateral)  Patient location during evaluation: Mother Baby Anesthesia Type: Epidural Level of consciousness: awake and alert and oriented Pain management: pain level controlled Vital Signs Assessment: post-procedure vital signs reviewed and stable Respiratory status: spontaneous breathing, nonlabored ventilation and respiratory function stable Cardiovascular status: stable Postop Assessment: no headache, no backache and epidural receding Anesthetic complications: no    Last Vitals:  Vitals:   03/20/16 0429 03/20/16 0840  BP: (!) 108/59 (!) 114/56  Pulse:  96  Resp: 18 20  Temp: 36.9 C 37 C    Last Pain:  Vitals:   03/20/16 0840  TempSrc: Oral  PainSc:                  [place]

## 2016-03-21 NOTE — Progress Notes (Signed)
Subjective: Postpartum Day 2: Cesarean Delivery Patient reports no problems . On daily lovenox Objective: Vital signs in last 24 hours: Temp:  [97.9 F (36.6 C)-98.7 F (37.1 C)] 97.9 F (36.6 C) (10/01 0732) Pulse Rate:  [86-105] 87 (10/01 0732) Resp:  [17-20] 17 (10/01 0732) BP: (103-133)/(55-63) 109/56 (10/01 0732) SpO2:  [96 %-99 %] 96 % (10/01 0732)  Physical Exam:  General: alert and cooperative Lochia: appropriate Uterine Fundus: firm Incision: healing well DVT Evaluation: No evidence of DVT seen on physical exam.   Recent Labs  03/19/16 0848 03/20/16 0559  HGB 12.4 9.9*  HCT 36.5 29.4*    Assessment/Plan: Status post Cesarean section. Doing well postoperatively.  Continue current care. D/c tomorrow SCHERMERHORN,THOMAS 03/21/2016, 10:37 AM

## 2016-03-22 ENCOUNTER — Encounter: Payer: Self-pay | Admitting: Obstetrics & Gynecology

## 2016-03-22 MED ORDER — DOCUSATE SODIUM 100 MG PO CAPS: 100.0000 mg | ORAL_CAPSULE | Freq: Every day | ORAL | 3 refills | Status: DC | PRN

## 2016-03-22 MED ORDER — OXYCODONE-ACETAMINOPHEN 5-325 MG PO TABS
1.0000 | ORAL_TABLET | Freq: Four times a day (QID) | ORAL | 0 refills | Status: DC | PRN
Start: 2016-03-22 — End: 2019-01-31

## 2016-03-22 MED ORDER — IBUPROFEN 800 MG PO TABS: 800.0000 mg | ORAL_TABLET | Freq: Three times a day (TID) | ORAL | 0 refills | Status: AC | PRN

## 2016-03-22 MED ORDER — ONDANSETRON 4 MG PO TBDP: 4.0000 mg | ORAL_TABLET | Freq: Three times a day (TID) | ORAL | 0 refills | Status: DC | PRN

## 2016-03-22 NOTE — Discharge Instructions (Signed)
Please call your doctor or return to the ER if you experience any chest pains, shortness of breath, fever greater than 101, any heavy bleeding or large clots, and foul smelling vaginal discharge, any worsening abdominal pain & cramping that is not controlled by pain medication, or any signs of post partum depression.  Please check your incision daily for redness or drainage.  No tampons, enemas, douches, or sexual intercourse for 6 weeks.  Also avoid tub baths, hot tubs, or swimming for 6 weeks.

## 2016-03-24 LAB — SURGICAL PATHOLOGY

## 2017-12-16 ENCOUNTER — Other Ambulatory Visit: Payer: Self-pay | Admitting: Physical Medicine and Rehabilitation

## 2017-12-16 DIAGNOSIS — M5412 Radiculopathy, cervical region: Secondary | ICD-10-CM

## 2018-01-03 ENCOUNTER — Encounter: Payer: Self-pay | Admitting: Radiology

## 2018-01-03 ENCOUNTER — Ambulatory Visit
Admission: RE | Admit: 2018-01-03 | Discharge: 2018-01-03 | Disposition: A | Discharge status: Home / Self Care | Payer: BLUE CROSS/BLUE SHIELD | Source: Ambulatory Visit | Attending: Physical Medicine and Rehabilitation | Admitting: Physical Medicine and Rehabilitation

## 2018-01-03 DIAGNOSIS — M5412 Radiculopathy, cervical region: Secondary | ICD-10-CM | POA: Insufficient documentation

## 2018-01-03 DIAGNOSIS — M50222 Other cervical disc displacement at C5-C6 level: Secondary | ICD-10-CM | POA: Diagnosis not present

## 2018-01-03 DIAGNOSIS — M4802 Spinal stenosis, cervical region: Secondary | ICD-10-CM | POA: Diagnosis not present

## 2019-01-15 ENCOUNTER — Other Ambulatory Visit: Payer: Self-pay | Admitting: Certified Nurse Midwife

## 2019-01-15 DIAGNOSIS — N6310 Unspecified lump in the right breast, unspecified quadrant: Secondary | ICD-10-CM

## 2019-01-19 ENCOUNTER — Other Ambulatory Visit: Payer: BLUE CROSS/BLUE SHIELD

## 2019-01-22 ENCOUNTER — Ambulatory Visit
Admission: RE | Admit: 2019-01-22 | Discharge: 2019-01-22 | Disposition: A | Discharge status: Home / Self Care | Payer: BLUE CROSS/BLUE SHIELD | Source: Ambulatory Visit | Attending: Certified Nurse Midwife | Admitting: Certified Nurse Midwife

## 2019-01-22 DIAGNOSIS — N6310 Unspecified lump in the right breast, unspecified quadrant: Secondary | ICD-10-CM | POA: Diagnosis not present

## 2019-01-30 ENCOUNTER — Other Ambulatory Visit: Payer: Self-pay

## 2019-01-31 ENCOUNTER — Inpatient Hospital Stay: Payer: BLUE CROSS/BLUE SHIELD | Attending: Oncology | Admitting: Oncology

## 2019-01-31 ENCOUNTER — Encounter (INDEPENDENT_AMBULATORY_CARE_PROVIDER_SITE_OTHER): Payer: Self-pay

## 2019-01-31 ENCOUNTER — Encounter: Payer: Self-pay | Admitting: Oncology

## 2019-01-31 ENCOUNTER — Other Ambulatory Visit: Payer: Self-pay

## 2019-01-31 VITALS — BP 120/73 | HR 85 | Temp 97.5°F | Resp 18 | Wt 143.1 lb

## 2019-01-31 DIAGNOSIS — R791 Abnormal coagulation profile: Secondary | ICD-10-CM | POA: Diagnosis not present

## 2019-01-31 DIAGNOSIS — Z87891 Personal history of nicotine dependence: Secondary | ICD-10-CM | POA: Diagnosis not present

## 2019-01-31 DIAGNOSIS — Z720 Tobacco use: Secondary | ICD-10-CM

## 2019-01-31 DIAGNOSIS — Z791 Long term (current) use of non-steroidal anti-inflammatories (NSAID): Secondary | ICD-10-CM | POA: Diagnosis not present

## 2019-01-31 DIAGNOSIS — N920 Excessive and frequent menstruation with regular cycle: Secondary | ICD-10-CM | POA: Insufficient documentation

## 2019-01-31 DIAGNOSIS — Z79899 Other long term (current) drug therapy: Secondary | ICD-10-CM | POA: Insufficient documentation

## 2019-01-31 DIAGNOSIS — R160 Hepatomegaly, not elsewhere classified: Secondary | ICD-10-CM | POA: Insufficient documentation

## 2019-01-31 DIAGNOSIS — D689 Coagulation defect, unspecified: Secondary | ICD-10-CM | POA: Insufficient documentation

## 2019-01-31 DIAGNOSIS — R062 Wheezing: Secondary | ICD-10-CM | POA: Insufficient documentation

## 2019-01-31 NOTE — Progress Notes (Signed)
New patient in for abnormal coagulation. Currently on menstrual cycle.

## 2019-02-01 DIAGNOSIS — R160 Hepatomegaly, not elsewhere classified: Secondary | ICD-10-CM | POA: Insufficient documentation

## 2019-02-01 DIAGNOSIS — R791 Abnormal coagulation profile: Secondary | ICD-10-CM | POA: Insufficient documentation

## 2019-02-01 DIAGNOSIS — Z720 Tobacco use: Secondary | ICD-10-CM | POA: Insufficient documentation

## 2019-02-01 NOTE — Progress Notes (Signed)
Hematology/Oncology Consult note [facility] [phone] [phone]   Patient Care Team: Tracie Harrier, MD as PCP - General (Internal Medicine)  REFERRING PROVIDER: Lisette Grinder, CNM  CHIEF COMPLAINTS/REASON FOR VISIT:  Evaluation of anticoagulation disorder.  HISTORY OF PRESENTING ILLNESS:   Sheryl Gross is a  38 y.o.  female with PMH listed below was seen in consultation at the request of Dr. Angelica Ran, Joycelyn Schmid, CNM  for evaluation of coagulation disorder.  Patient was seen by gynecologist Dr. Leafy Ro for menorrhagia evaluation.  Also pelvic pain. Menstrual.  Duration 7 days, with 2 to 3 days very heavy flow.  She uses tampons.  Moderate dysmenorrhea. Reports having blood clots. GYN work-up showed ultrasound pelvis normal uterus, endometrium is 13.7 mm.  Right ovary complex cyst 2.69 cm. Laboratory work-up for abnormal uterine bleeding showed 01/17/2019 CBC showed WBC 7.2, hemoglobin 13.6, MCV 90.4.  Platelet count 276. Normal differential. Von Willebrand screening testing showed normal von Willebrand factor, von Willebrand antigen, increased factor VIII activity 177 [56 -140%]. Patient denies any previous thrombosis history.  Reports easy bruising, occasional gum bleeding. Denies any family history of bleeding diathesis, grandmother had DVT. Reports feeling fatigued  Review of Systems  Constitutional: Positive for fatigue. Negative for appetite change, chills and fever.  HENT:   Negative for hearing loss and voice change.   Eyes: Negative for eye problems.  Respiratory: Negative for chest tightness and cough.   Cardiovascular: Negative for chest pain.  Gastrointestinal: Negative for abdominal distention, abdominal pain and blood in stool.  Endocrine: Negative for hot flashes.  Genitourinary: Negative for difficulty urinating and frequency.   Musculoskeletal: Negative for arthralgias.  Skin: Negative for itching  and rash.  Neurological: Negative for extremity weakness.  Hematological: Negative for adenopathy. Bruises/bleeds easily.  Psychiatric/Behavioral: Negative for confusion.    MEDICAL HISTORY:  Past Medical History:  Diagnosis Date   Spinal headache     SURGICAL HISTORY: Past Surgical History:  Procedure Laterality Date   CESAREAN SECTION WITH BILATERAL TUBAL LIGATION Bilateral 03/19/2016   Procedure: CESAREAN SECTION WITH BILATERAL TUBAL LIGATION;  Surgeon: Honor Loh Ward, MD;  Location: [facility] ORS;  Service: Obstetrics;  Laterality: Bilateral;  has epidural, anesthesia choice   GANGLION CYST EXCISION Right 2000   right wrist    SOCIAL HISTORY: Social History   Socioeconomic History   Marital status: Married    Spouse name: Not on file   Number of children: Not on file   Years of education: Not on file   Highest education level: Not on file  Occupational History   Not on file  Social Needs   Financial resource strain: Not on file   Food insecurity    Worry: Not on file    Inability: Not on file   Transportation needs    Medical: Not on file    Non-medical: Not on file  Tobacco Use   Smoking status: Former Smoker   Smokeless tobacco: Former Systems developer  Substance and Sexual Activity   Alcohol use: No   Drug use: No   Sexual activity: Yes  Lifestyle   Physical activity    Days per week: Not on file    Minutes per session: Not on file   Stress: Not on file  Relationships   Social connections    Talks on phone: Not on file    Gets together: Not on file    Attends religious service: Not on file    Active member of club or  organization: Not on file    Attends meetings of clubs or organizations: Not on file    Relationship status: Not on file   Intimate partner violence    Fear of current or ex partner: Not on file    Emotionally abused: Not on file    Physically abused: Not on file    Forced sexual activity: Not on file  Other Topics Concern   Not  on file  Social History Narrative   Not on file    FAMILY HISTORY: Family History  Problem Relation Age of Onset   Ovarian cancer Paternal Grandmother     ALLERGIES:  is allergic to prevacid [lansoprazole].  MEDICATIONS:  Current Outpatient Medications  Medication Sig Dispense Refill   cetirizine (ZYRTEC) 10 MG tablet Take 10 mg by mouth daily.     lamoTRIgine (LAMICTAL) 25 MG tablet Take by mouth.     meloxicam (MOBIC) 7.5 MG tablet Take by mouth.     No current facility-administered medications for this visit.      PHYSICAL EXAMINATION: ECOG PERFORMANCE STATUS: 1 - Symptomatic but completely ambulatory Vitals:   01/31/19 1528  BP: 120/73  Pulse: 85  Resp: 18  Temp: (!) 97.5 F (36.4 C)   Filed Weights   01/31/19 1528  Weight: 143 lb 1.6 oz (64.9 kg)    Physical Exam Constitutional:      General: She is not in acute distress. HENT:     Head: Normocephalic and atraumatic.  Eyes:     General: No scleral icterus.    Pupils: Pupils are equal, round, and reactive to light.  Neck:     Musculoskeletal: Normal range of motion and neck supple.  Cardiovascular:     Rate and Rhythm: Normal rate and regular rhythm.     Heart sounds: Normal heart sounds.  Pulmonary:     Effort: Pulmonary effort is normal. No respiratory distress.     Breath sounds: No wheezing.  Abdominal:     General: Bowel sounds are normal. There is no distension.     Palpations: Abdomen is soft. There is no mass.     Comments: Palpable liver edge  Musculoskeletal: Normal range of motion.        General: No deformity.  Skin:    General: Skin is warm and dry.     Findings: No erythema or rash.  Neurological:     Mental Status: She is alert and oriented to person, place, and time.     Cranial Nerves: No cranial nerve deficit.     Coordination: Coordination normal.  Psychiatric:        Behavior: Behavior normal.        Thought Content: Thought content normal.     LABORATORY DATA:  I  have reviewed the data as listed Lab Results  Component Value Date   WBC 12.2 (H) 03/20/2016   HGB 9.9 (L) 03/20/2016   HCT 29.4 (L) 03/20/2016   MCV 90.9 03/20/2016   PLT 171 03/20/2016   No results for input(s): NA, K, CL, CO2, GLUCOSE, BUN, CREATININE, CALCIUM, GFRNONAA, GFRAA, PROT, ALBUMIN, AST, ALT, ALKPHOS, BILITOT, BILIDIR, IBILI in the last 8760 hours. Iron/TIBC/Ferritin/ %Sat No results found for: IRON, TIBC, FERRITIN, IRONPCTSAT    RADIOGRAPHIC STUDIES: I have personally reviewed the radiological images as listed and agreed with the findings in the report.  [place] Breast Ltd Uni Right Inc Axilla  Result Date: 01/22/2019 CLINICAL DATA:  38 year old female complaining of a palpable abnormality in the right  breast. EXAM: DIGITAL DIAGNOSTIC BILATERAL MAMMOGRAM WITH CAD AND TOMO ULTRASOUND RIGHT BREAST COMPARISON:  None. ACR Breast Density Category c: The breast tissue is heterogeneously dense, which may obscure small masses. FINDINGS: No suspicious mass, malignant type microcalcifications or distortion detected in either breast. Mammographic images were processed with CAD. On physical exam, I palpate a superficial soft area of thickening in the right breast at 10 o'clock 9 cm from the nipple. Targeted ultrasound is performed, showing normal tissue in the right breast at 10 o'clock 9 cm from the nipple. No suspicious mass, distortion or abnormal shadowing detected. IMPRESSION: No evidence of malignancy in either breast. RECOMMENDATION: If the clinical exam remains benign/stable no follow-up is needed. I have discussed the findings and recommendations with the patient. Results were also provided in writing at the conclusion of the visit. If applicable, a reminder letter will be sent to the patient regarding the next appointment. BI-RADS CATEGORY  1: Negative. Electronically Signed   By: Baird Lyonsina  Arceo M.D.   On: 01/22/2019 15:04   Mm Diag Breast Tomo Bilateral  Result Date: 01/22/2019 CLINICAL  DATA:  38 year old female complaining of a palpable abnormality in the right breast. EXAM: DIGITAL DIAGNOSTIC BILATERAL MAMMOGRAM WITH CAD AND TOMO ULTRASOUND RIGHT BREAST COMPARISON:  None. ACR Breast Density Category c: The breast tissue is heterogeneously dense, which may obscure small masses. FINDINGS: No suspicious mass, malignant type microcalcifications or distortion detected in either breast. Mammographic images were processed with CAD. On physical exam, I palpate a superficial soft area of thickening in the right breast at 10 o'clock 9 cm from the nipple. Targeted ultrasound is performed, showing normal tissue in the right breast at 10 o'clock 9 cm from the nipple. No suspicious mass, distortion or abnormal shadowing detected. IMPRESSION: No evidence of malignancy in either breast. RECOMMENDATION: If the clinical exam remains benign/stable no follow-up is needed. I have discussed the findings and recommendations with the patient. Results were also provided in writing at the conclusion of the visit. If applicable, a reminder letter will be sent to the patient regarding the next appointment. BI-RADS CATEGORY  1: Negative. Electronically Signed   By: Baird Lyonsina  Arceo M.D.   On: 01/22/2019 15:04      ASSESSMENT & PLAN:  1. Elevated factor VIII level   2. Hepatomegaly   3. Tobacco abuse   4. Wheezing    #Labs reviewed and discussed with patient. Factor VIII level can serve as acute reactant, can be falsely elevated due to stress, acute inflammation etc. I recommend repeat level to see if the level is persistently elevated.  Menorrhagia with clots, etiology unknown. Check TSH, PT, PTT, factor VIII antigen, repeat von Willebrand panel,  Palpable liver edge, I will proceed with right upper quadrant limited ultrasound.  Check LFT Tobacco abuse/wheezing Smoke cessation was discussed in details.  She is not interested at this point.  Orders Placed This Encounter  Procedures   US Abdomen Limited     Standing Status:   Future    Standing Expiration Date:   01/31/2020    Order Specific Question:   Reason for Exam (SYMPTOM  OR DIAGNOSIS REQUIRED)    Answer:   hepatomagaly    Order Specific Question:   Preferred imaging location?    Answer:   [facility]   Factor VIII Antigen    Standing Status:   Future    Standing Expiration Date:   01/31/2020   Protime-INR    Standing Status:   Future  Standing Expiration Date:   01/31/2020   APTT    Standing Status:   Future    Standing Expiration Date:   01/31/2020   Hepatic function panel    Standing Status:   Future    Standing Expiration Date:   02/01/2020    All questions were answered. The patient knows to call the clinic with any problems questions or concerns.  Cc Barbette ReichmannHande, Vishwanath, MD  Dr.Beasley  Genia DelHaviland, Margaret, CNM    Return of visit: to be determined.  Thank you for this kind referral and the opportunity to participate in the care of this patient. A copy of today's note is routed to referring provider     Rickard PatienceZhou Skylie Hiott, MD, PhD Hematology Oncology [facility] Pager- [phone] 02/01/2019

## 2019-02-09 ENCOUNTER — Ambulatory Visit: Admission: RE | Admit: 2019-02-09 | Payer: BLUE CROSS/BLUE SHIELD | Source: Ambulatory Visit

## 2019-02-14 ENCOUNTER — Other Ambulatory Visit: Payer: Self-pay

## 2019-02-14 DIAGNOSIS — R791 Abnormal coagulation profile: Secondary | ICD-10-CM

## 2019-02-16 ENCOUNTER — Inpatient Hospital Stay: Payer: BLUE CROSS/BLUE SHIELD

## 2019-03-01 ENCOUNTER — Other Ambulatory Visit: Payer: Self-pay

## 2019-03-01 ENCOUNTER — Encounter
Admission: RE | Admit: 2019-03-01 | Discharge: 2019-03-01 | Disposition: A | Discharge status: Home / Self Care | Payer: BLUE CROSS/BLUE SHIELD | Source: Ambulatory Visit | Attending: Obstetrics & Gynecology | Admitting: Obstetrics & Gynecology

## 2019-03-01 HISTORY — DX: Depression, unspecified: F32.A

## 2019-03-01 HISTORY — DX: Personal history of urinary calculi: Z87.442

## 2019-03-01 HISTORY — DX: Anxiety disorder, unspecified: F41.9

## 2019-03-01 NOTE — Patient Instructions (Addendum)
Your procedure is scheduled on: Wednesday, March 07, 2019 Report to Day Surgery on the 2nd floor of the [facility]. To find out your arrival time, please call [phone] between 1PM - 3PM on: Tuesday, March 06, 2019  REMEMBER: Instructions that are not followed completely may result in serious medical risk, up to and including death; or upon the discretion of your surgeon and anesthesiologist your surgery may need to be rescheduled.  Do not eat food after midnight the night before surgery.  No gum chewing, lozengers or hard candies.  You may however, drink CLEAR liquids up to 2 hours before you are scheduled to arrive for your surgery. Do not drink anything within 2 hours of the start of your surgery.  Clear liquids include: - water  - apple juice without pulp - gatorade - black coffee or tea (Do NOT add milk or creamers to the coffee or tea) Do NOT drink anything that is not on this list.  ENSURE PRE-SURGERY CARBOHYDRATE DRINK:  Complete drinking 2 hours prior to leaving for the hospital.   No Alcohol for 24 hours before or after surgery.  No Smoking including e-cigarettes for 24 hours prior to surgery.  No chewable tobacco products for at least 6 hours prior to surgery.  No nicotine patches on the day of surgery.  On the morning of surgery brush your teeth with toothpaste and water, you may rinse your mouth with mouthwash if you wish. Do not swallow any toothpaste or mouthwash.  Notify your doctor if there is any change in your medical condition (cold, fever, infection).  Do not wear jewelry, make-up, hairpins, clips or nail polish.  Do not wear lotions, powders, or perfumes.   Do not shave 48 hours prior to surgery.   Contacts and dentures may not be worn into surgery.  Do not bring valuables to the hospital, including drivers license, insurance or credit cards.  [facility] is not responsible for any belongings or valuables.   TAKE THESE MEDICATIONS THE  MORNING OF SURGERY:  none  Use CHG Soap as directed on instruction sheet.  NOW!  Stop Anti-inflammatories (NSAIDS) such as Advil, Aleve, Ibuprofen, Motrin, Naproxen, Naprosyn and Aspirin based products such as Excedrin, Goodys Powder, BC Powder. (May take Tylenol or Acetaminophen if needed.)  NOW!  Stop ANY OVER THE COUNTER supplements until after surgery.  Wear comfortable clothing (specific to your surgery type) to the hospital.  If you are being discharged the day of surgery, you will not be allowed to drive home. You will need a responsible adult to drive you home and stay with you that night.   If you are taking public transportation, you will need to have a responsible adult with you. Please confirm with your physician that it is acceptable to use public transportation.   Please call [phone] if you have any questions about these instructions.

## 2019-03-02 ENCOUNTER — Other Ambulatory Visit
Admission: RE | Admit: 2019-03-02 | Discharge: 2019-03-02 | Disposition: A | Discharge status: Home / Self Care | Payer: BLUE CROSS/BLUE SHIELD | Source: Ambulatory Visit | Attending: Obstetrics & Gynecology | Admitting: Obstetrics & Gynecology

## 2019-03-02 DIAGNOSIS — Z20828 Contact with and (suspected) exposure to other viral communicable diseases: Secondary | ICD-10-CM | POA: Diagnosis not present

## 2019-03-02 DIAGNOSIS — Z01812 Encounter for preprocedural laboratory examination: Secondary | ICD-10-CM | POA: Diagnosis present

## 2019-03-02 LAB — BASIC METABOLIC PANEL
Anion gap: 8 (ref 5–15)
BUN: 9 mg/dL (ref 6–20)
CO2: 27 mmol/L (ref 22–32)
Calcium: 9 mg/dL (ref 8.9–10.3)
Chloride: 106 mmol/L (ref 98–111)
Creatinine, Ser: 0.76 mg/dL (ref 0.44–1.00)
GFR calc Af Amer: >60 mL/min (ref >60)
GFR calc non Af Amer: >60 mL/min (ref >60)
Glucose, Bld: 97 mg/dL (ref 70–99)
Potassium: 3.9 mmol/L (ref 3.5–5.1)
Sodium: 141 mmol/L (ref 135–145)

## 2019-03-02 LAB — CBC
HCT: 41.6 % (ref 36.0–46.0)
Hemoglobin: 13.3 g/dL (ref 12.0–15.0)
MCH: 29.2 pg (ref 26.0–34.0)
MCHC: 32 g/dL (ref 30.0–36.0)
MCV: 91.2 fL (ref 80.0–100.0)
Platelets: 272 10*3/uL (ref 150–400)
RBC: 4.56 MIL/uL (ref 3.87–5.11)
RDW: 13.3 % (ref 11.5–15.5)
WBC: 6 10*3/uL (ref 4.0–10.5)
nRBC: 0 % (ref 0.0–0.2)

## 2019-03-03 LAB — TYPE AND SCREEN
ABO/RH(D): A NEG
Antibody Screen: POSITIVE

## 2019-03-03 LAB — SARS CORONAVIRUS 2 (TAT 6-24 HRS): SARS Coronavirus 2: NEGATIVE

## 2019-03-07 ENCOUNTER — Ambulatory Visit: Payer: BLUE CROSS/BLUE SHIELD | Admitting: Certified Registered"

## 2019-03-07 ENCOUNTER — Other Ambulatory Visit: Payer: Self-pay

## 2019-03-07 ENCOUNTER — Ambulatory Visit
Admission: RE | Admit: 2019-03-07 | Discharge: 2019-03-07 | Disposition: A | Discharge status: Home / Self Care | Payer: BLUE CROSS/BLUE SHIELD | Attending: Obstetrics & Gynecology | Admitting: Obstetrics & Gynecology

## 2019-03-07 ENCOUNTER — Encounter
Admission: RE | Disposition: A | Discharge status: Home / Self Care | Payer: Self-pay | Source: Home / Self Care | Attending: Obstetrics & Gynecology

## 2019-03-07 DIAGNOSIS — N800 Endometriosis of uterus: Secondary | ICD-10-CM | POA: Diagnosis not present

## 2019-03-07 DIAGNOSIS — Z9079 Acquired absence of other genital organ(s): Secondary | ICD-10-CM | POA: Diagnosis not present

## 2019-03-07 DIAGNOSIS — F17210 Nicotine dependence, cigarettes, uncomplicated: Secondary | ICD-10-CM | POA: Diagnosis not present

## 2019-03-07 DIAGNOSIS — N858 Other specified noninflammatory disorders of uterus: Secondary | ICD-10-CM | POA: Insufficient documentation

## 2019-03-07 DIAGNOSIS — N938 Other specified abnormal uterine and vaginal bleeding: Secondary | ICD-10-CM | POA: Diagnosis present

## 2019-03-07 HISTORY — PX: LAPAROSCOPIC SUPRACERVICAL HYSTERECTOMY: SHX5399

## 2019-03-07 LAB — POCT PREGNANCY, URINE: Preg Test, Ur: NEGATIVE

## 2019-03-07 SURGERY — HYSTERECTOMY, SUPRACERVICAL, LAPAROSCOPIC
Anesthesia: General

## 2019-03-07 MED ORDER — ESMOLOL HCL 100 MG/10ML IV SOLN
INTRAVENOUS | Status: AC
  Filled 2019-03-07: qty 10

## 2019-03-07 MED ORDER — ROCURONIUM BROMIDE 100 MG/10ML IV SOLN
INTRAVENOUS | Status: DC | PRN
  Administered 2019-03-07: 10 mg via INTRAVENOUS
  Administered 2019-03-07: 40 mg via INTRAVENOUS
  Administered 2019-03-07 (×3): 10 mg via INTRAVENOUS

## 2019-03-07 MED ORDER — HYDROMORPHONE HCL 1 MG/ML IJ SOLN
INTRAMUSCULAR | Status: AC
  Filled 2019-03-07: qty 1

## 2019-03-07 MED ORDER — HEPARIN SODIUM (PORCINE) 5000 UNIT/ML IJ SOLN
INTRAMUSCULAR | Status: AC
  Administered 2019-03-07: 5000 [IU] via SUBCUTANEOUS
  Filled 2019-03-07: qty 1

## 2019-03-07 MED ORDER — FENTANYL CITRATE (PF) 100 MCG/2ML IJ SOLN
INTRAMUSCULAR | Status: AC
  Filled 2019-03-07: qty 2

## 2019-03-07 MED ORDER — GABAPENTIN 300 MG PO CAPS
600.0000 mg | ORAL_CAPSULE | ORAL | Status: AC
  Administered 2019-03-07: 10:00:00 600 mg via ORAL

## 2019-03-07 MED ORDER — HEPARIN SODIUM (PORCINE) 5000 UNIT/ML IJ SOLN
5000.0000 [IU] | INTRAMUSCULAR | Status: AC
  Administered 2019-03-07: 10:00:00 5000 [IU] via SUBCUTANEOUS

## 2019-03-07 MED ORDER — FENTANYL CITRATE (PF) 100 MCG/2ML IJ SOLN
INTRAMUSCULAR | Status: DC | PRN
  Administered 2019-03-07 (×2): 50 ug via INTRAVENOUS

## 2019-03-07 MED ORDER — FENTANYL CITRATE (PF) 100 MCG/2ML IJ SOLN: 25.0000 ug | INTRAMUSCULAR | Status: DC | PRN

## 2019-03-07 MED ORDER — HYDROMORPHONE HCL 1 MG/ML IJ SOLN
INTRAMUSCULAR | Status: DC | PRN
  Administered 2019-03-07: 0.5 mg via INTRAVENOUS

## 2019-03-07 MED ORDER — ONDANSETRON HCL 4 MG/2ML IJ SOLN: 4.0000 mg | Freq: Once | INTRAMUSCULAR | Status: DC | PRN

## 2019-03-07 MED ORDER — LIDOCAINE HCL (CARDIAC) PF 100 MG/5ML IV SOSY
PREFILLED_SYRINGE | INTRAVENOUS | Status: DC | PRN
  Administered 2019-03-07: 80 mg via INTRAVENOUS

## 2019-03-07 MED ORDER — DEXAMETHASONE SODIUM PHOSPHATE 10 MG/ML IJ SOLN
INTRAMUSCULAR | Status: DC | PRN
  Administered 2019-03-07: 10 mg via INTRAVENOUS

## 2019-03-07 MED ORDER — IBUPROFEN 200 MG PO TABS: 600.0000 mg | ORAL_TABLET | Freq: Four times a day (QID) | ORAL | 0 refills | Status: DC

## 2019-03-07 MED ORDER — DEXAMETHASONE SODIUM PHOSPHATE 10 MG/ML IJ SOLN
INTRAMUSCULAR | Status: AC
  Administered 2019-03-07: 10:00:00 4 mg via INTRAVENOUS
  Filled 2019-03-07: qty 1

## 2019-03-07 MED ORDER — OXYCODONE HCL 5 MG PO TABS
ORAL_TABLET | ORAL | Status: AC
  Filled 2019-03-07: qty 1

## 2019-03-07 MED ORDER — SCOPOLAMINE 1 MG/3DAYS TD PT72
MEDICATED_PATCH | TRANSDERMAL | Status: AC
  Administered 2019-03-07: 10:00:00 1.5 mg via TRANSDERMAL
  Filled 2019-03-07: qty 1

## 2019-03-07 MED ORDER — ONDANSETRON HCL 4 MG/2ML IJ SOLN
INTRAMUSCULAR | Status: DC | PRN
  Administered 2019-03-07: 4 mg via INTRAVENOUS

## 2019-03-07 MED ORDER — ROCURONIUM BROMIDE 50 MG/5ML IV SOLN
INTRAVENOUS | Status: AC
  Filled 2019-03-07: qty 1

## 2019-03-07 MED ORDER — DEXAMETHASONE SODIUM PHOSPHATE 10 MG/ML IJ SOLN
4.0000 mg | INTRAMUSCULAR | Status: AC
  Administered 2019-03-07: 10:00:00 4 mg via INTRAVENOUS

## 2019-03-07 MED ORDER — LIDOCAINE HCL (PF) 2 % IJ SOLN
INTRAMUSCULAR | Status: AC
  Filled 2019-03-07: qty 10

## 2019-03-07 MED ORDER — MIDAZOLAM HCL 2 MG/2ML IJ SOLN
INTRAMUSCULAR | Status: DC | PRN
  Administered 2019-03-07: 2 mg via INTRAVENOUS

## 2019-03-07 MED ORDER — PROPOFOL 10 MG/ML IV BOLUS
INTRAVENOUS | Status: DC | PRN
  Administered 2019-03-07: 150 mg via INTRAVENOUS

## 2019-03-07 MED ORDER — GLYCOPYRROLATE 0.2 MG/ML IJ SOLN
INTRAMUSCULAR | Status: DC | PRN
  Administered 2019-03-07: 0.1 mg via INTRAVENOUS

## 2019-03-07 MED ORDER — MIDAZOLAM HCL 2 MG/2ML IJ SOLN
INTRAMUSCULAR | Status: AC
  Filled 2019-03-07: qty 2

## 2019-03-07 MED ORDER — PHENYLEPHRINE HCL (PRESSORS) 10 MG/ML IV SOLN
INTRAVENOUS | Status: DC | PRN
  Administered 2019-03-07: 100 ug via INTRAVENOUS

## 2019-03-07 MED ORDER — ACETAMINOPHEN 325 MG PO TABS: 650.0000 mg | ORAL_TABLET | ORAL | Status: DC | PRN

## 2019-03-07 MED ORDER — FAMOTIDINE 20 MG PO TABS
ORAL_TABLET | ORAL | Status: AC
  Administered 2019-03-07: 10:00:00 20 mg via ORAL
  Filled 2019-03-07: qty 1

## 2019-03-07 MED ORDER — LACTATED RINGERS IV SOLN: INTRAVENOUS | Status: DC

## 2019-03-07 MED ORDER — ACETAMINOPHEN 650 MG RE SUPP
650.0000 mg | RECTAL | Status: DC | PRN
  Filled 2019-03-07: qty 1

## 2019-03-07 MED ORDER — KETOROLAC TROMETHAMINE 30 MG/ML IJ SOLN
30.0000 mg | Freq: Four times a day (QID) | INTRAMUSCULAR | Status: DC
  Administered 2019-03-07: 15:00:00 30 mg via INTRAVENOUS
  Filled 2019-03-07: qty 1

## 2019-03-07 MED ORDER — SUGAMMADEX SODIUM 200 MG/2ML IV SOLN
INTRAVENOUS | Status: DC | PRN
  Administered 2019-03-07: 240 mg via INTRAVENOUS

## 2019-03-07 MED ORDER — KETOROLAC TROMETHAMINE 30 MG/ML IJ SOLN
INTRAMUSCULAR | Status: AC
  Filled 2019-03-07: qty 1

## 2019-03-07 MED ORDER — SUCCINYLCHOLINE CHLORIDE 20 MG/ML IJ SOLN
INTRAMUSCULAR | Status: AC
  Filled 2019-03-07: qty 1

## 2019-03-07 MED ORDER — EPHEDRINE SULFATE 50 MG/ML IJ SOLN
INTRAMUSCULAR | Status: DC | PRN
  Administered 2019-03-07 (×2): 5 mg via INTRAVENOUS

## 2019-03-07 MED ORDER — SODIUM CHLORIDE 0.9 % IV SOLN
INTRAVENOUS | Status: DC | PRN
  Administered 2019-03-07: 15 ug/min via INTRAVENOUS

## 2019-03-07 MED ORDER — CEFAZOLIN SODIUM-DEXTROSE 2-4 GM/100ML-% IV SOLN
INTRAVENOUS | Status: AC
  Filled 2019-03-07: qty 100

## 2019-03-07 MED ORDER — ACETAMINOPHEN 500 MG PO TABS
1000.0000 mg | ORAL_TABLET | ORAL | Status: AC
  Administered 2019-03-07: 10:00:00 1000 mg via ORAL

## 2019-03-07 MED ORDER — EPHEDRINE SULFATE 50 MG/ML IJ SOLN
INTRAMUSCULAR | Status: AC
  Filled 2019-03-07: qty 1

## 2019-03-07 MED ORDER — GLYCOPYRROLATE 0.2 MG/ML IJ SOLN
INTRAMUSCULAR | Status: AC
  Filled 2019-03-07: qty 1

## 2019-03-07 MED ORDER — SUGAMMADEX SODIUM 500 MG/5ML IV SOLN
INTRAVENOUS | Status: AC
  Filled 2019-03-07: qty 5

## 2019-03-07 MED ORDER — CEFAZOLIN SODIUM-DEXTROSE 2-4 GM/100ML-% IV SOLN
2.0000 g | INTRAVENOUS | Status: AC
  Administered 2019-03-07: 12:00:00 2 g via INTRAVENOUS

## 2019-03-07 MED ORDER — CELECOXIB 200 MG PO CAPS
ORAL_CAPSULE | ORAL | Status: AC
  Administered 2019-03-07: 10:00:00 400 mg via ORAL
  Filled 2019-03-07: qty 2

## 2019-03-07 MED ORDER — MORPHINE SULFATE (PF) 4 MG/ML IV SOLN: 1.0000 mg | INTRAVENOUS | Status: DC | PRN

## 2019-03-07 MED ORDER — LACTATED RINGERS IV SOLN
INTRAVENOUS | Status: DC
  Administered 2019-03-07: 12:00:00 via INTRAVENOUS

## 2019-03-07 MED ORDER — GABAPENTIN 300 MG PO CAPS
ORAL_CAPSULE | ORAL | Status: AC
  Administered 2019-03-07: 10:00:00 600 mg via ORAL
  Filled 2019-03-07: qty 2

## 2019-03-07 MED ORDER — ACETAMINOPHEN 500 MG PO TABS
ORAL_TABLET | ORAL | Status: AC
  Administered 2019-03-07: 10:00:00 1000 mg via ORAL
  Filled 2019-03-07: qty 2

## 2019-03-07 MED ORDER — FAMOTIDINE 20 MG PO TABS
20.0000 mg | ORAL_TABLET | Freq: Once | ORAL | Status: AC
  Administered 2019-03-07: 10:00:00 20 mg via ORAL

## 2019-03-07 MED ORDER — SCOPOLAMINE 1 MG/3DAYS TD PT72
1.0000 | MEDICATED_PATCH | TRANSDERMAL | Status: DC
  Administered 2019-03-07: 10:00:00 1.5 mg via TRANSDERMAL

## 2019-03-07 MED ORDER — ONDANSETRON HCL 4 MG/2ML IJ SOLN
INTRAMUSCULAR | Status: AC
  Filled 2019-03-07: qty 4

## 2019-03-07 MED ORDER — OXYCODONE HCL 5 MG PO TABS: 5.0000 mg | ORAL_TABLET | Freq: Three times a day (TID) | ORAL | 0 refills | Status: AC | PRN

## 2019-03-07 MED ORDER — CELECOXIB 200 MG PO CAPS
400.0000 mg | ORAL_CAPSULE | ORAL | Status: AC
  Administered 2019-03-07: 10:00:00 400 mg via ORAL

## 2019-03-07 MED ORDER — OXYCODONE HCL 5 MG PO TABS
5.0000 mg | ORAL_TABLET | Freq: Once | ORAL | Status: AC
  Administered 2019-03-07: 16:00:00 5 mg via ORAL

## 2019-03-07 SURGICAL SUPPLY — 49 items
BAG URINE DRAINAGE (UROLOGICAL SUPPLIES) ×2 IMPLANT
BASIN GRAD PLASTIC 32OZ STRL (MISCELLANEOUS) ×2 IMPLANT
BLADE SURG SZ11 CARB STEEL (BLADE) ×6 IMPLANT
CANISTER SUCT 1200ML W/VALVE (MISCELLANEOUS) ×2 IMPLANT
CATH FOLEY 2WAY  5CC 16FR (CATHETERS) ×1
CATH URTH 16FR FL 2W BLN LF (CATHETERS) ×1 IMPLANT
CHLORAPREP W/TINT 26 (MISCELLANEOUS) ×2 IMPLANT
COVER WAND RF STERILE (DRAPES) IMPLANT
DEFOGGER SCOPE WARMER CLEARIFY (MISCELLANEOUS) ×2 IMPLANT
DERMABOND ADVANCED (GAUZE/BANDAGES/DRESSINGS) ×1
DERMABOND ADVANCED .7 DNX12 (GAUZE/BANDAGES/DRESSINGS) ×1 IMPLANT
DRAPE 3/4 80X56 (DRAPES) IMPLANT
GLOVE PI ORTHOPRO 6.5 (GLOVE) ×1
GLOVE PI ORTHOPRO STRL 6.5 (GLOVE) ×1 IMPLANT
GLOVE SURG SYN 6.5 ES PF (GLOVE) ×20 IMPLANT
GOWN STRL REUS W/ TWL LRG LVL3 (GOWN DISPOSABLE) ×3 IMPLANT
GOWN STRL REUS W/ TWL XL LVL3 (GOWN DISPOSABLE) ×1 IMPLANT
GOWN STRL REUS W/TWL LRG LVL3 (GOWN DISPOSABLE) ×3
GOWN STRL REUS W/TWL XL LVL3 (GOWN DISPOSABLE) ×1
GRASPER SUT TROCAR 14GX15 (MISCELLANEOUS) ×2 IMPLANT
IRRIGATION STRYKERFLOW (MISCELLANEOUS) ×1 IMPLANT
IRRIGATOR STRYKERFLOW (MISCELLANEOUS) ×2
IV LACTATED RINGERS 1000ML (IV SOLUTION) ×2 IMPLANT
KIT PINK PAD W/HEAD ARE REST (MISCELLANEOUS) ×2
KIT PINK PAD W/HEAD ARM REST (MISCELLANEOUS) ×1 IMPLANT
KIT TURNOVER CYSTO (KITS) ×2 IMPLANT
L-HOOK LAP DISP 36CM (ELECTROSURGICAL) ×2
LABEL OR SOLS (LABEL) ×2 IMPLANT
LHOOK LAP DISP 36CM (ELECTROSURGICAL) ×1 IMPLANT
LIGASURE VESSEL 5MM BLUNT TIP (ELECTROSURGICAL) ×2 IMPLANT
MORCELLATOR XCISE  COR (MISCELLANEOUS) ×1
MORCELLATOR XCISE COR (MISCELLANEOUS) ×1 IMPLANT
NS IRRIG 500ML POUR BTL (IV SOLUTION) ×2 IMPLANT
PACK LAP CHOLECYSTECTOMY (MISCELLANEOUS) ×2 IMPLANT
PAD OB MATERNITY 4.3X12.25 (PERSONAL CARE ITEMS) ×2 IMPLANT
PAD PREP 24X41 OB/GYN DISP (PERSONAL CARE ITEMS) ×2 IMPLANT
PENCIL ELECTRO HAND CTR (MISCELLANEOUS) ×2 IMPLANT
POUCH SPECIMEN RETRIEVAL 10MM (ENDOMECHANICALS) ×2 IMPLANT
RETRACTOR RING XSMALL (MISCELLANEOUS) ×1 IMPLANT
RTRCTR WOUND ALEXIS 13CM XS SH (MISCELLANEOUS) ×2
SLEEVE ENDOPATH XCEL 5M (ENDOMECHANICALS) ×2 IMPLANT
SUT MNCRL 4-0 (SUTURE) ×1
SUT MNCRL 4-0 27XMFL (SUTURE) ×1
SUT VIC AB 0 CT1 36 (SUTURE) ×2 IMPLANT
SUT VICRYL 0 AB UR-6 (SUTURE) ×2 IMPLANT
SUTURE MNCRL 4-0 27XMF (SUTURE) ×1 IMPLANT
TROCAR ENDO BLADELESS 11MM (ENDOMECHANICALS) ×2 IMPLANT
TROCAR XCEL NON-BLD 5MMX100MML (ENDOMECHANICALS) ×2 IMPLANT
TUBING EVAC SMOKE HEATED PNEUM (TUBING) ×2 IMPLANT

## 2019-03-07 NOTE — Transfer of Care (Signed)
Immediate Anesthesia Transfer of Care Note  Patient: Sheryl Gross  Procedure(s) Performed: LAPAROSCOPIC SUPRACERVICAL HYSTERECTOMY (N/A )  Patient Location: PACU  Anesthesia Type:General  Level of Consciousness: drowsy and responds to stimulation  Airway & Oxygen Therapy: Patient Spontanous Breathing and Patient connected to face mask oxygen  Post-op Assessment: Report given to RN and Post -op Vital signs reviewed and stable  Post vital signs: Reviewed and stable  Last Vitals:  Vitals Value Taken Time  BP 126/62 03/07/19 1425  Temp 36.5 C 03/07/19 1425  Pulse 80 03/07/19 1430  Resp 0 03/07/19 1430  SpO2 100 % 03/07/19 1430  Vitals shown include unvalidated device data.  Last Pain:  Vitals:   03/07/19 1425  TempSrc:   PainSc: Asleep         Complications: No apparent anesthesia complications

## 2019-03-07 NOTE — H&P (Signed)
Preoperative History and Physical  Sheryl Gross is a 38 y.o. [phone] [id number] here for surgical management of menorrhagia and elevated factor 8 level.   No significant preoperative concerns.  Proposed surgery: total laparoscopic hysterectomy  Past Medical History:  Diagnosis Date  . Anxiety   . Depression   . History of kidney stones   . Spinal headache    Past Surgical History:  Procedure Laterality Date  . CESAREAN SECTION WITH BILATERAL TUBAL LIGATION Bilateral 03/19/2016   Procedure: CESAREAN SECTION WITH BILATERAL TUBAL LIGATION;  Surgeon: Elenora Fenderhelsea C , MD;  Location: [facility] ORS;  Service: Obstetrics;  Laterality: Bilateral;  has epidural, anesthesia choice  . GANGLION CYST EXCISION Right 2000   right wrist  . WISDOM TOOTH EXTRACTION     OB History  Gravida Para Term Preterm AB Living  4 1 1   1 3   SAB TAB Ectopic Multiple Live Births  1     0 1    # Outcome Date GA Lbr Len/2nd Weight Sex Delivery Anes PTL Lv  4 Term 03/19/16 [id number]  4090 g F CS-LTranv EPI, Spinal  LIV  3 Gravida           2 Gravida           1 SAB           Patient denies any other pertinent gynecologic issues.  History of bilateral salpingectomy  No current facility-administered medications on file prior to encounter.    Current Outpatient Medications on File Prior to Encounter  Medication Sig Dispense Refill  . ibuprofen (ADVIL) 200 MG tablet Take 400 mg by mouth every 6 (six) hours as needed for moderate pain.    Marland Kitchen. lamoTRIgine (LAMICTAL) 25 MG tablet Take 25 mg by mouth 2 (two) times daily.      Allergies  Allergen Reactions  . Prevacid [Lansoprazole] Hives    Social History:   reports that she has been smoking cigarettes. She has been smoking about 0.50 packs per day. She has never used smokeless tobacco. She reports current alcohol use. She reports that she does not use drugs.  Family History  Problem Relation Age of Onset  . Ovarian cancer Paternal Grandmother     Review of Systems:  Noncontributory  PHYSICAL EXAM: Blood pressure 127/73, pulse 90, temperature 99 F (37.2 C), temperature source Tympanic, resp. rate 18, height 5\' 3"  (1.6 m), weight 61.7 kg, last menstrual period 02/21/2019, SpO2 100 %, unknown if currently breastfeeding. General appearance - alert, well appearing, and in no distress Chest - clear to auscultation, no wheezes, rales or rhonchi, symmetric air entry Heart - normal rate and regular rhythm Abdomen - soft, nontender, nondistended, no masses or organomegaly Pelvic - examination not indicated Extremities - peripheral pulses normal, no pedal edema, no clubbing or cyanosis  Labs: Results for orders placed or performed during the hospital encounter of 03/07/19 (from the past 336 hour(s))  Pregnancy, urine POC   Collection Time: 03/07/19  9:50 AM  Result Value Ref Range   Preg Test, Ur NEGATIVE NEGATIVE  Results for orders placed or performed during the hospital encounter of 03/02/19 (from the past 336 hour(s))  Basic metabolic panel   Collection Time: 03/02/19  8:48 AM  Result Value Ref Range   Sodium 141 135 - 145 mmol/L   Potassium 3.9 3.5 - 5.1 mmol/L   Chloride 106 98 - 111 mmol/L   CO2 27 22 - 32 mmol/L   Glucose, Bld 97 70 - 99 mg/dL  BUN 9 6 - 20 mg/dL   Creatinine, Ser 0.76 0.44 - 1.00 mg/dL   Calcium 9.0 8.9 - 10.3 mg/dL   GFR calc non Af Amer >60 >60 mL/min   GFR calc Af Amer >60 >60 mL/min   Anion gap 8 5 - 15  CBC   Collection Time: 03/02/19  8:48 AM  Result Value Ref Range   WBC 6.0 4.0 - 10.5 K/uL   RBC 4.56 3.87 - 5.11 MIL/uL   Hemoglobin 13.3 12.0 - 15.0 g/dL   HCT 41.6 36.0 - 46.0 %   MCV 91.2 80.0 - 100.0 fL   MCH 29.2 26.0 - 34.0 pg   MCHC 32.0 30.0 - 36.0 g/dL   RDW 13.3 11.5 - 15.5 %   Platelets 272 150 - 400 K/uL   nRBC 0.0 0.0 - 0.2 %  Type and screen [place]   Collection Time: 03/02/19  8:48 AM  Result Value Ref Range   ABO/RH(D) A NEG    Antibody Screen POS    Sample  Expiration 03/16/2019,2359    Extend sample reason NO TRANSFUSIONS OR PREGNANCY IN THE PAST 3 MONTHS    Antibody Identification      NON SPECIFIC ANTIBODY REACTIVITY Performed at [facility], [address] [place], [place] [address]   SARS CORONAVIRUS 2 (TAT 6-24 HRS) Nasopharyngeal Nasopharyngeal Swab   Collection Time: 03/02/19  9:18 AM   Specimen: Nasopharyngeal Swab  Result Value Ref Range   SARS Coronavirus 2 NEGATIVE NEGATIVE    Imaging Studies: No results found.  Assessment: Patient Active Problem List   Diagnosis Date Noted  . Tobacco abuse 02/01/2019  . Hepatomegaly 02/01/2019  . Elevated factor VIII level 02/01/2019  . Labor and delivery, indication for care 03/19/2016  . Pre-eclampsia during pregnancy in third trimester, antepartum 03/03/2016    Plan: Patient will undergo surgical management with [facility].   The risks of surgery were discussed in detail with the patient including but not limited to: bleeding which may require transfusion or reoperation; infection which may require antibiotics; injury to surrounding organs which may involve bowel, bladder, ureters ; need for additional procedures including laparoscopy or laparotomy; thromboembolic phenomenon, surgical site problems and other postoperative/anesthesia complications. Likelihood of success in alleviating the patient's condition was discussed. Routine postoperative instructions will be reviewed with the patient and her family in detail after surgery.  The patient concurred with the proposed plan, giving informed written consent for the surgery.  Patient has been NPO since last night she will remain NPO for procedure.  Anesthesia and OR aware.  Preoperative prophylactic antibiotics and SCDs ordered on call to the OR.  To OR when ready.  ----- Larey Days, MD, [place] Attending Obstetrician and Gynecologist [facility], Department of [facility]

## 2019-03-07 NOTE — Op Note (Addendum)
Laparoscopic Supracervical Hysterectomy Operative Note Procedure Date: 03/07/2019  Patient:  Sheryl Gross  38 y.o. female  PRE-OPERATIVE DIAGNOSIS:  Disfunctional uterine bleeding  POST-OPERATIVE DIAGNOSIS:  Disfunctional uterine bleeding  PROCEDURE:  Procedure(s): LAPAROSCOPIC SUPRACERVICAL HYSTERECTOMY (N/A)  SURGEON:  Surgeon(s) and Role:    * Jaya Lapka, Honor Loh, MD - Primary  ANESTHESIA:  General via ET  I/O  See flowsheets  FINDINGS:  Small uterus, normal ovaries and absent fallopian tubes bilaterally.  Normal upper abdomen. 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  SPECIMEN: Uterus (morcellated)  COMPLICATIONS: none apparent  DISPOSITION: vital signs stable to PACU  Indication for Surgery: 38 y.o. G4P3 with previous tubal ligation presents for definitive management of her heave menstrual bleeding.  Risks of surgery were discussed with the patient including but not limited to: bleeding which may require transfusion or reoperation; infection which may require antibiotics; injury to bowel, bladder, ureters or other surrounding organs; need for additional procedures including laparotomy, blood clot, incisional problems and other postoperative/anesthesia complications. Written informed consent was obtained.      PROCEDURE IN DETAIL:  The patient had 5000u Heparin Sub-q and sequential compression devices applied to her lower extremities while in the preoperative area.  She was then taken to the operating room. IV antibiotics were given. General anesthesia was administered via endotracheal route.  She was placed in the dorsal lithotomy position, and was prepped and draped in a sterile manner. A surgical time-out was performed.  A Foley catheter was inserted into her bladder and attached to constant drainage and a uterine manipulator was then advanced into the uterus. The gloves were changed, and attention was  turned to the abdomen where an umbilical incision was made with the scalpel.  An 65mm trochar was inserted in the umbilical incision using a visiport method.Opening pressure was 4, and the abdomen was insufflated to 59mmHg carbon dioxide gas and adequate pneumoperitoneum was obtained. A survey of the patient's pelvis and abdomen revealed the findings as mentioned above. A 67mm port was inserted suprapubically under visualization.    The bilateral round ligaments were transected and anterior broad ligament divided and brought across the uterus to separate the vesicouterine peritoneum and create a bladder flap. The bladder was pushed away from the uterus. The bilateral uterine arteries were skeletonized, ligated and transected. The bilateral uterosacral and cardinal ligaments were ligated and transected. Monopolar cautery was used to transect the uterus from the cervix.  The uterus was placed in an endocatch bag and then the suprapubic incision was enlarged, the bag brought to the surface, and the alexis retractor placed inside.  The Exite technique was used to remove the uterus.  The retractor was removed and the skin closed with 0-vicryl.  The pneumoperitoneum was recreated and surgical site inspected, and found to be hemostatic. Bilateral ureters were visualized vermiuclating. No intraoperative injury to surrounding organs was noted. The abdomen was desufflated and all instruments were then removed.   All skin incisions were closed with 4-0 monocryl and covered with surgical glue. The patient tolerated the procedures well.  All instruments, needles, and sponge counts were correct x 2. The patient was taken to the recovery room in stable condition.   ---- Larey Days, MD Attending Obstetrician and [facility]

## 2019-03-07 NOTE — Anesthesia Preprocedure Evaluation (Signed)
Anesthesia Evaluation  Patient identified by MRN, date of birth, ID band  Reviewed: Allergy & Precautions, NPO status , Patient's Chart, lab work & pertinent test results  History of Anesthesia Complications (+) POST - OP SPINAL HEADACHE and history of anesthetic complications  Airway Mallampati: II  TM Distance: <3 FB Neck ROM: full    Dental no notable dental hx.    Pulmonary neg pulmonary ROS, Current Smoker, former smoker,    Pulmonary exam normal        Cardiovascular negative cardio ROS Normal cardiovascular exam     Neuro/Psych PSYCHIATRIC DISORDERS Anxiety Depression    GI/Hepatic negative GI ROS, Neg liver ROS,   Endo/Other  negative endocrine ROS  Renal/GU negative Renal ROS  negative genitourinary   Musculoskeletal negative musculoskeletal ROS (+)   Abdominal   Peds negative pediatric ROS (+)  Hematology negative hematology ROS (+)   Anesthesia Other Findings Past Medical History: No date: Anxiety No date: Depression No date: History of kidney stones No date: Spinal headache  Reproductive/Obstetrics                             Anesthesia Physical  Anesthesia Plan  ASA: II and emergent  Anesthesia Plan: General   Post-op Pain Management:    Induction: Intravenous  PONV Risk Score and Plan:   Airway Management Planned: Oral ETT  Additional Equipment:   Intra-op Plan:   Post-operative Plan: Extubation in OR  Informed Consent: I have reviewed the patients History and Physical, chart, labs and discussed the procedure including the risks, benefits and alternatives for the proposed anesthesia with the patient or authorized representative who has indicated his/her understanding and acceptance.       Plan Discussed with: Anesthesiologist and CRNA  Anesthesia Plan Comments:         Anesthesia Quick Evaluation

## 2019-03-07 NOTE — Anesthesia Procedure Notes (Signed)
Procedure Name: Intubation Date/Time: 03/07/2019 11:54 AM Performed by: Johnna Acosta, CRNA Pre-anesthesia Checklist: Patient identified, Emergency Drugs available, Suction available, Patient being monitored and Timeout performed Patient Re-evaluated:Patient Re-evaluated prior to induction Oxygen Delivery Method: Circle system utilized Preoxygenation: Pre-oxygenation with 100% oxygen Induction Type: IV induction Ventilation: Mask ventilation without difficulty Laryngoscope Size: Miller and 2 Grade View: Grade I Tube type: Oral Tube size: 6.5 mm Number of attempts: 1 Airway Equipment and Method: Stylet Placement Confirmation: ETT inserted through vocal cords under direct vision,  positive ETCO2 and breath sounds checked- equal and bilateral Secured at: 22 cm Tube secured with: Tape Dental Injury: Teeth and Oropharynx as per pre-operative assessment

## 2019-03-07 NOTE — Anesthesia Post-op Follow-up Note (Signed)
Anesthesia QCDR form completed.        

## 2019-03-07 NOTE — Discharge Instructions (Addendum)
Discharge instructions:  °Call office if you have any of the following: fever >101 F, chills, shortness of breath, excessive vaginal bleeding, incision drainage or problems, leg pain or redness, or any other concerns.  ° °Activity: Do not lift > 10 lbs for 8 weeks.  °No intercourse or tampons for 8 weeks.  °No driving for 1-2 weeks.  ° °You may feel some pain in your upper right abdomen/rib and right shoulder.  This is from the gas in the abdomen for surgery. This will subside over time, please be patient! ° °Take 600mg Ibuprofen and 1000mg Tylenol around the clock, every 6 hours for at least the first 3-5 days.  After this you can take as needed.  This will help decrease inflammation and promote healing.  The narcotics you'll take just as needed, as they just trick your brain into thinking its not in pain.   ° °Please don't limit yourself in terms of routine activity.  You will be able to do most things, although they may take longer to do or be a little painful.  You can do it! ° °Don't be a hero, but don't be a wimp either!  ° °AMBULATORY SURGERY  °DISCHARGE INSTRUCTIONS ° ° °1) The drugs that you were given will stay in your system until tomorrow so for the next 24 hours you should not: ° °A) Drive an automobile °B) Make any legal decisions °C) Drink any alcoholic beverage ° ° °2) You may resume regular meals tomorrow.  Today it is better to start with liquids and gradually work up to solid foods. ° °You may eat anything you prefer, but it is better to start with liquids, then soup and crackers, and gradually work up to solid foods. ° ° °3) Please notify your doctor immediately if you have any unusual bleeding, trouble breathing, redness and pain at the surgery site, drainage, fever, or pain not relieved by medication. ° ° ° °4) Additional Instructions: ° ° ° ° ° ° ° °Please contact your physician with any problems or Same Day Surgery at [phone], Monday through Friday 6 am to 4 pm, or [place] at  [place] [facility] number at [phone]. ° °

## 2019-03-08 NOTE — Anesthesia Postprocedure Evaluation (Signed)
Anesthesia Post Note  Patient: Sheryl Gross  Procedure(s) Performed: LAPAROSCOPIC SUPRACERVICAL HYSTERECTOMY (N/A )  Patient location during evaluation: PACU Anesthesia Type: General Level of consciousness: awake and alert Pain management: pain level controlled Vital Signs Assessment: post-procedure vital signs reviewed and stable Respiratory status: spontaneous breathing, nonlabored ventilation and respiratory function stable Cardiovascular status: blood pressure returned to baseline and stable Postop Assessment: no apparent nausea or vomiting Anesthetic complications: no     Last Vitals:  Vitals:   03/07/19 1634 03/07/19 1700  BP:    Pulse: 82 80  Resp: 16 17  Temp: (!) 36.3 C (!) 36.4 C  SpO2: 98% 98%    Last Pain:  Vitals:   03/07/19 1700  TempSrc:   PainSc: [place]

## 2019-03-09 LAB — SURGICAL PATHOLOGY

## 2020-07-21 ENCOUNTER — Ambulatory Visit
Admission: EM | Admit: 2020-07-21 | Discharge: 2020-07-21 | Disposition: A | Discharge status: Home / Self Care | Payer: Medicaid Other

## 2020-07-21 ENCOUNTER — Other Ambulatory Visit: Payer: Self-pay

## 2020-07-21 ENCOUNTER — Encounter: Payer: Self-pay | Admitting: Emergency Medicine

## 2020-07-21 DIAGNOSIS — Z8616 Personal history of COVID-19: Secondary | ICD-10-CM | POA: Diagnosis not present

## 2020-07-21 NOTE — Discharge Instructions (Addendum)
Return for any new worsening symptoms. °

## 2020-07-21 NOTE — ED Provider Notes (Signed)
[facility]    CSN: [id number] Arrival date & time: 07/21/20  0809      History   Chief Complaint No chief complaint on file.   HPI Sheryl Gross is a 40 y.o. female presenting for a "return to school note" after recently having COVID-19.  Patient states that she began having symptoms of nasal congestion, headaches and fatigue on 07/13/2020.  Patient tested positive on 07/15/2020 through [facility].  She states that she feels better, has complete her isolation, and is ready to return to school.  She denies any fever, headache, cough, sinus pain, ear pain, chest pain, breathing difficulty, abdominal pain, vomiting or diarrhea.  She has no other concerns today.  HPI  Past Medical History:  Diagnosis Date  . Anxiety   . Depression   . History of kidney stones   . Spinal headache     Patient Active Problem List   Diagnosis Date Noted  . Tobacco abuse 02/01/2019  . Hepatomegaly 02/01/2019  . 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 CESAREAN SECTION WITH BILATERAL TUBAL LIGATION Bilateral 03/19/2016   Procedure: CESAREAN SECTION WITH BILATERAL TUBAL LIGATION;  Surgeon: Elenora Fender Ward, MD;  Location: [facility] ORS;  Service: Obstetrics;  Laterality: Bilateral;  has epidural, anesthesia choice  . GANGLION CYST EXCISION Right 2000   right wrist  . LAPAROSCOPIC SUPRACERVICAL HYSTERECTOMY N/A 03/07/2019   Procedure: LAPAROSCOPIC SUPRACERVICAL HYSTERECTOMY;  Surgeon: Ward, Elenora Fender, MD;  Location: [facility] ORS;  Service: Gynecology;  Laterality: N/A;  . WISDOM TOOTH EXTRACTION      OB History    Gravida  4   Para  1   Term  1   Preterm      AB  1   Living  3     SAB  1   IAB      Ectopic      Multiple  0   Live Births  1            Home Medications    Prior to Admission medications   Medication Sig Start Date End Date Taking? Authorizing Provider  ibuprofen (ADVIL) 200 MG tablet Take 3 tablets  (600 mg total) by mouth every 6 (six) hours. 03/07/19   Ward, Elenora Fender, MD  lamoTRIgine (LAMICTAL) 25 MG tablet Take 25 mg by mouth 2 (two) times daily.  01/16/19 04/16/19  [provider]    Family History Family History  Problem Relation Age of Onset  . Ovarian cancer Paternal Grandmother     Social History Social History   Tobacco Use  . Smoking status: Former Smoker    Packs/day: 0.50    Types: Cigarettes  . Smokeless tobacco: Never Used  Vaping Use  . Vaping Use: Never used  Substance Use Topics  . Alcohol use: Yes    Comment: occassional  . Drug use: No     Allergies   Prevacid [lansoprazole]   Review of Systems Review of Systems  Constitutional: Negative for chills, diaphoresis, fatigue and fever.  HENT: Negative for congestion, ear pain, rhinorrhea, sinus pressure, sinus pain and sore throat.   Respiratory: Negative for cough and shortness of breath.   Gastrointestinal: Negative for abdominal pain, nausea and vomiting.  Musculoskeletal: Negative for arthralgias and myalgias.  Skin: Negative for rash.  Neurological: Negative for weakness and headaches.  Hematological: Negative for adenopathy.     Physical Exam Triage Vital Signs ED Triage Vitals  Enc Vitals Group     BP 07/21/20 0823 (!) 142/55     Pulse Rate 07/21/20 0823 87     Resp 07/21/20 0823 18     Temp 07/21/20 0823 98.3 F (36.8 C)     Temp Source 07/21/20 0823 Oral     SpO2 07/21/20 0823 98 %     Weight 07/21/20 0821 165 lb (74.8 kg)     Height 07/21/20 0821 5\' 3"  (1.6 m)     Head Circumference --      Peak Flow --      Pain Score 07/21/20 0821 0     Pain Loc --      Pain Edu? --      Excl. in GC? --    No data found.  Updated Vital Signs BP (!) 142/55 (BP Location: Right Arm)   Pulse 87   Temp 98.3 F (36.8 C) (Oral)   Resp 18   Ht 5\' 3"  (1.6 m)   Wt 165 lb (74.8 kg)   LMP 02/21/2019 (Exact Date)   SpO2 98%   BMI 29.23 kg/m       Physical Exam Vitals and  nursing note reviewed.  Constitutional:      General: She is not in acute distress.    Appearance: Normal appearance. She is not ill-appearing or toxic-appearing.  HENT:     Head: Normocephalic and atraumatic.     Nose: Nose normal.     Mouth/Throat:     Mouth: Mucous membranes are moist.     Pharynx: Oropharynx is clear.  Eyes:     General: No scleral icterus.       Right eye: No discharge.        Left eye: No discharge.     Conjunctiva/sclera: Conjunctivae normal.  Cardiovascular:     Rate and Rhythm: Normal rate and regular rhythm.     Heart sounds: Normal heart sounds.  Pulmonary:     Effort: Pulmonary effort is normal. No respiratory distress.     Breath sounds: Normal breath sounds.  Musculoskeletal:     Cervical back: Neck supple.  Skin:    General: Skin is dry.  Neurological:     General: No focal deficit present.     Mental Status: She is alert. Mental status is at baseline.     Motor: No weakness.     Gait: Gait normal.  Psychiatric:        Mood and Affect: Mood normal.        Behavior: Behavior normal.        Thought Content: Thought content normal.      UC Treatments / Results  Labs (all labs ordered are listed, but only abnormal results are displayed) Labs Reviewed - No data to display  EKG   Radiology No results found.  Procedures Procedures (including critical care time)  Medications Ordered in UC Medications - No data to display  Initial Impression / Assessment and Plan / UC Course  I have reviewed the triage vital signs and the nursing notes.  Pertinent labs & imaging results that were available during my care of the patient were reviewed by me and considered in my medical decision making (see chart for details).   40 year old female with recent COVID-19 infection presenting for a return to school note.  I was able to access the positive COVID-19 test result that was performed [facility].  In the clinic today, patient's vitals are  stable and she  is well-appearing.  Exam is benign.  Return to school note given and advised to wear a mask directly for 5 more days.  Advised to follow-up with [place] as needed if she begins to feel bad again.  Patient agreeable.   Final Clinical Impressions(s) / UC Diagnoses   Final diagnoses:  Personal history of COVID-19     Discharge Instructions     -Return for any new/worsening symptoms    ED Prescriptions    None     PDMP not reviewed this encounter.   Shirlee Latch, PA-C 07/21/20 [phone]

## 2020-07-21 NOTE — ED Triage Notes (Signed)
Patient here for work note so she can return to school at [employer]. She tested positive on 01/25 for COVID and her program will not allow her to return until she has a note. Her symptoms started on 01/23. She denies any symptoms at this time.

## 2020-11-27 IMAGING — MG DIGITAL DIAGNOSTIC BILATERAL MAMMOGRAM WITH TOMO AND CAD
6 of 10 series · 6 of 30 positions shown · non-contrast
Comparison: None.

CLINICAL DATA: 37-year-old female complaining of a palpable
abnormality in the right breast.

EXAM:
DIGITAL DIAGNOSTIC BILATERAL MAMMOGRAM WITH CAD AND TOMO
ULTRASOUND RIGHT BREAST

[L CC synth-2D]
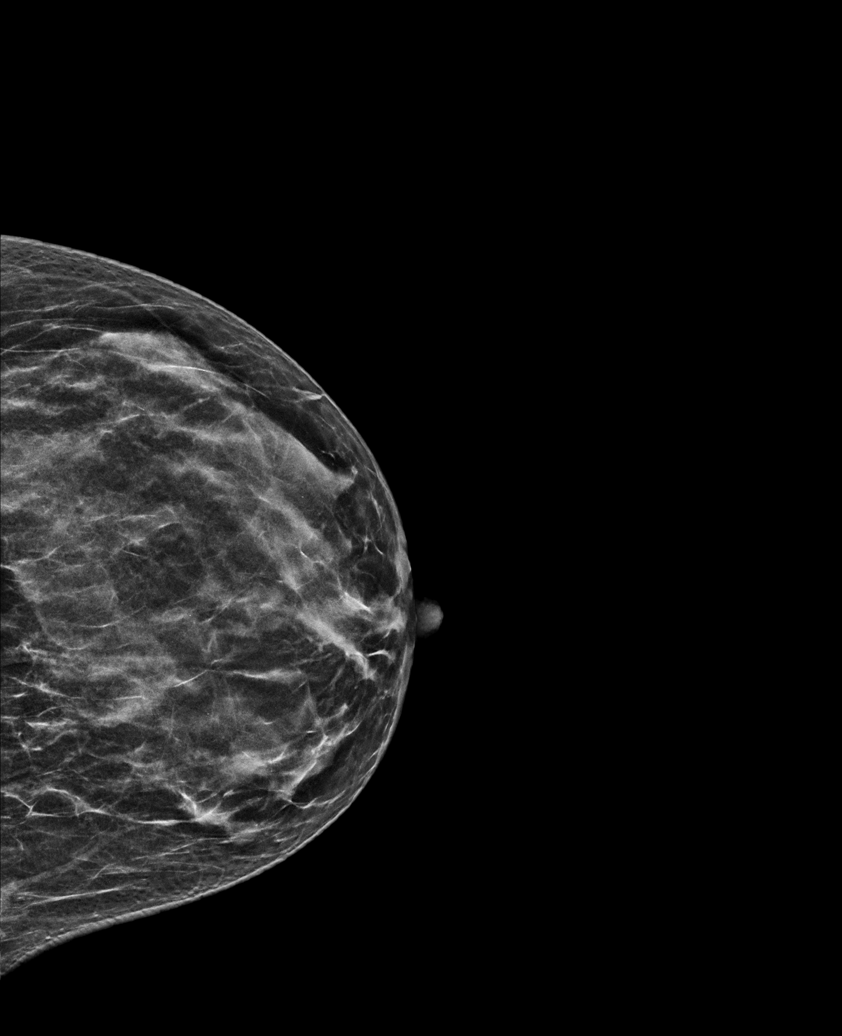

[L MLO synth-2D]
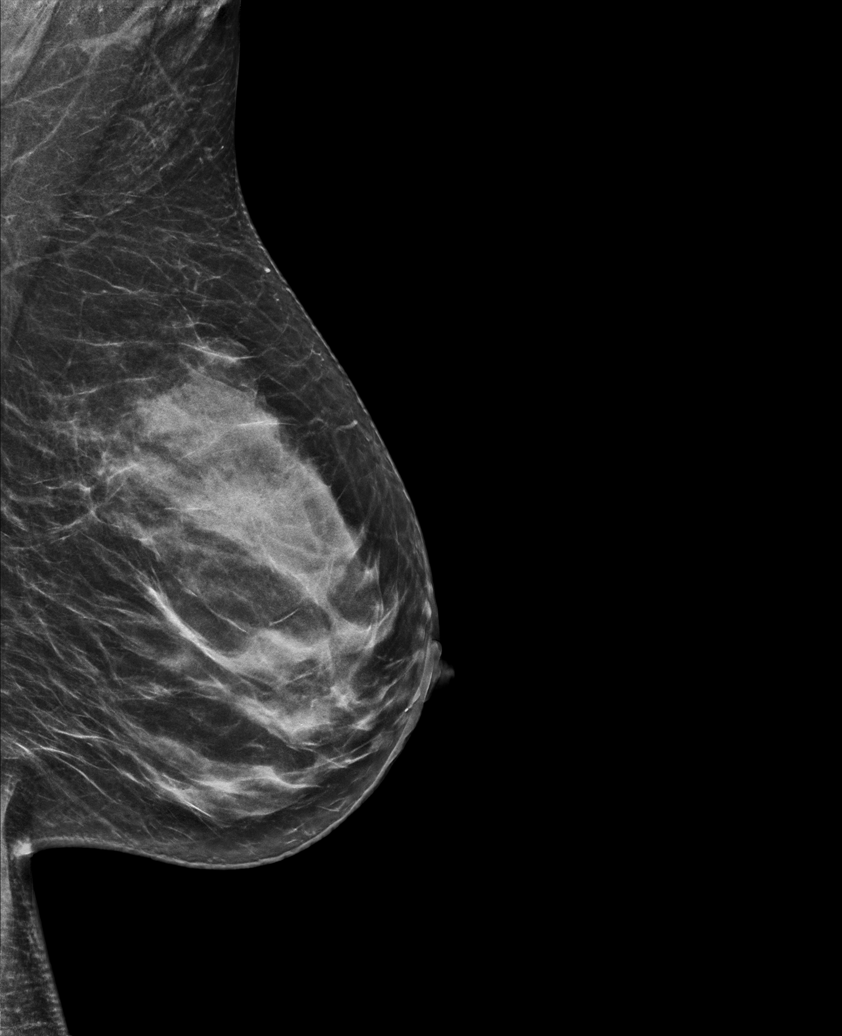

[R CC synth-2D (1 of 2)]
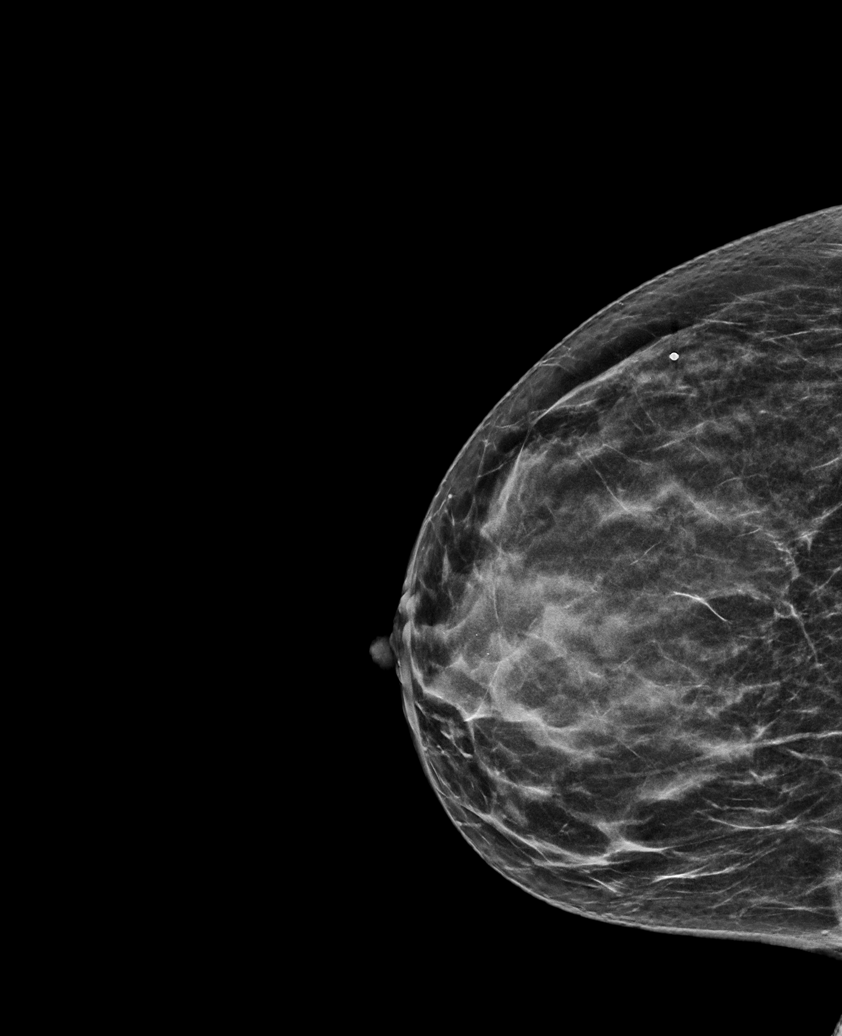

[R CC synth-2D (2 of 2)]
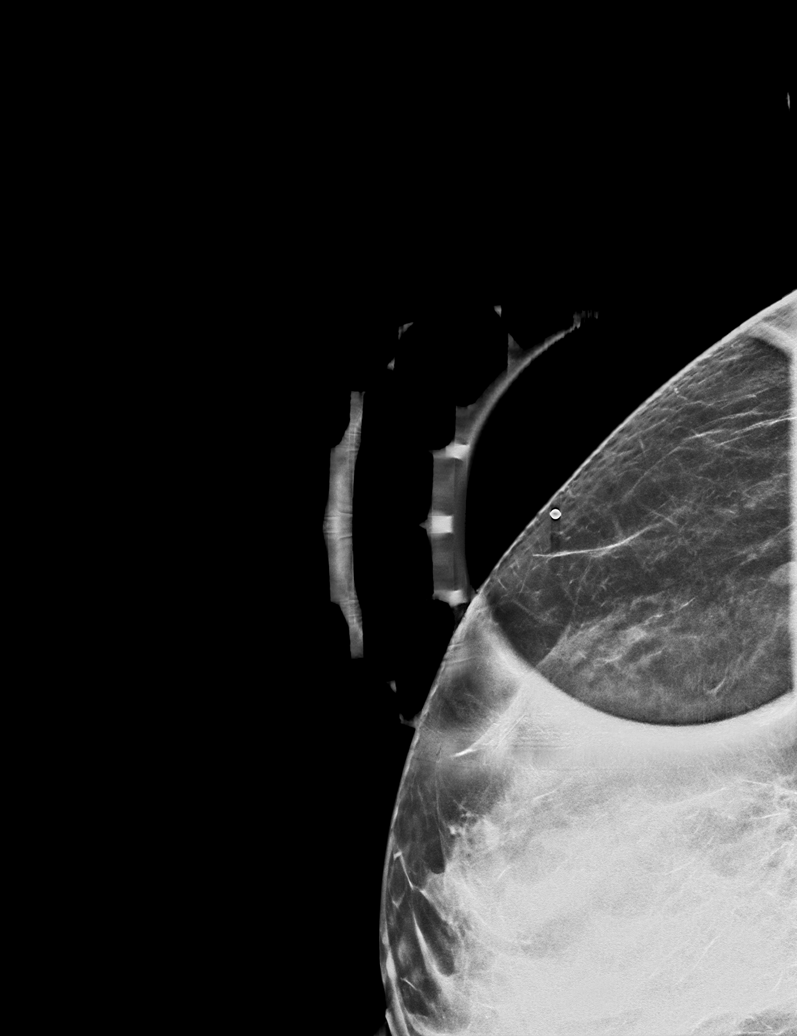

[R MLO synth-2D]
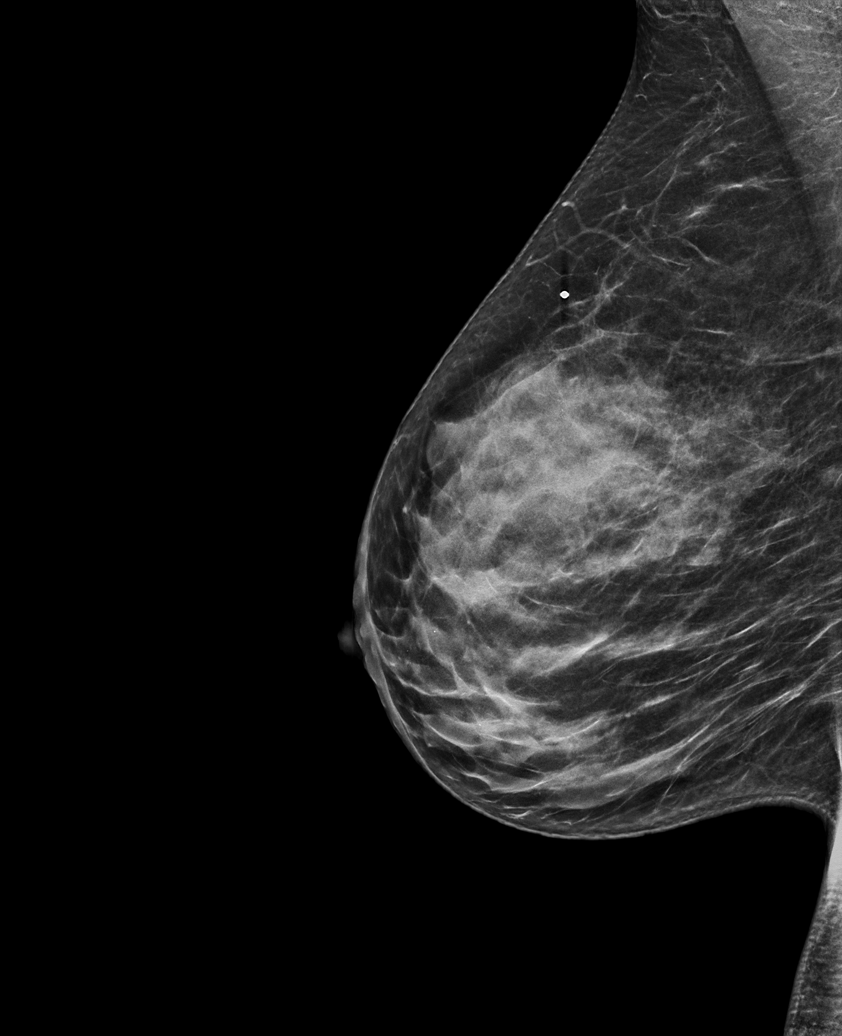

[L MLO tomo · tomo slice 31/61.0]
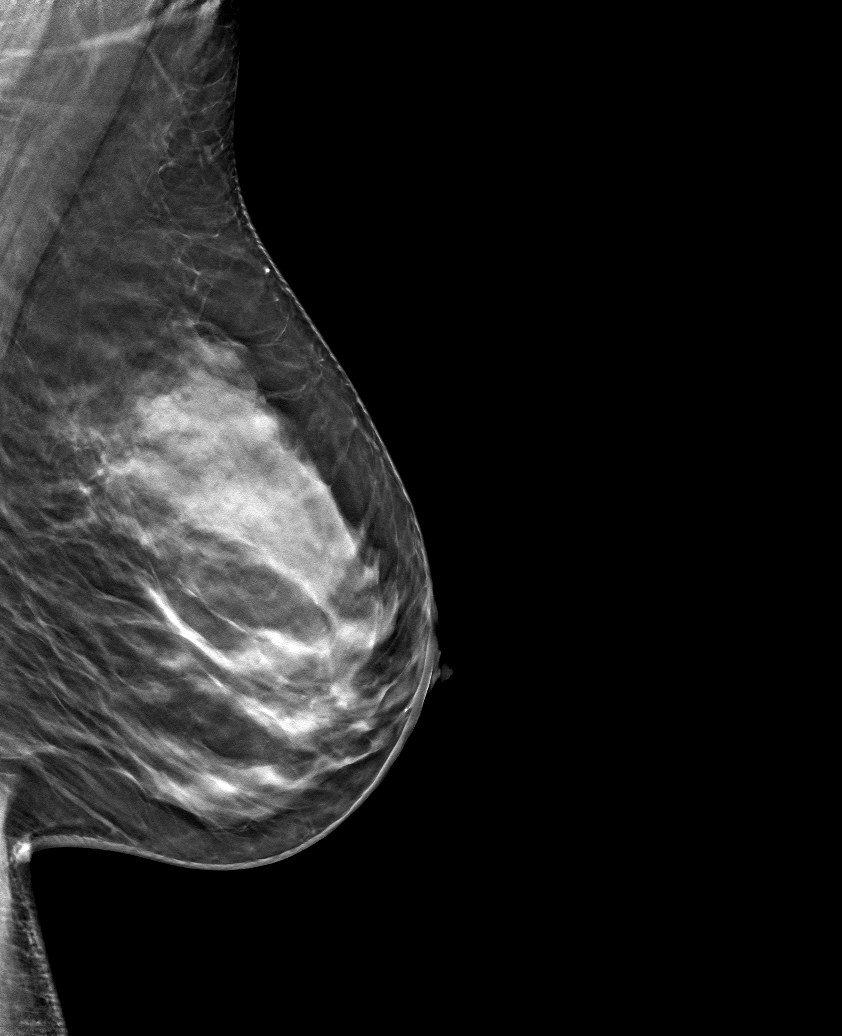

[6 of 30 positions shown; findings below may reference images not displayed]

ACR Breast Density Category c: The breast tissue is heterogeneously
dense, which may obscure small masses.
FINDINGS: No suspicious mass, malignant type microcalcifications or distortion
detected in either breast.

Mammographic images were processed with CAD.

On physical exam, I palpate a superficial soft area of thickening in
the right breast at 10 o'clock 9 cm from the nipple.

Targeted ultrasound is performed, showing normal tissue in the right
breast at 10 o'clock 9 cm from the nipple. No suspicious mass,
distortion or abnormal shadowing detected.
IMPRESSION: No evidence of malignancy in either breast.

RECOMMENDATION:
If the clinical exam remains benign/stable no follow-up is needed.

I have discussed the findings and recommendations with the patient.
Results were also provided in writing at the conclusion of the
visit. If applicable, a reminder letter will be sent to the patient
regarding the next appointment.

BI-RADS CATEGORY  1: Negative.

## 2020-11-27 IMAGING — US US BREAST*R* LIMITED INC AXILLA
1 series · 2 of 2 positions shown · non-contrast
Comparison: None.

CLINICAL DATA: 37-year-old female complaining of a palpable
abnormality in the right breast.

EXAM:
DIGITAL DIAGNOSTIC BILATERAL MAMMOGRAM WITH CAD AND TOMO
ULTRASOUND RIGHT BREAST

[Series 1: us breast*right* limited inc axilla · 0.04mm/px · 2 of 2 slices shown]
[im 1/2]
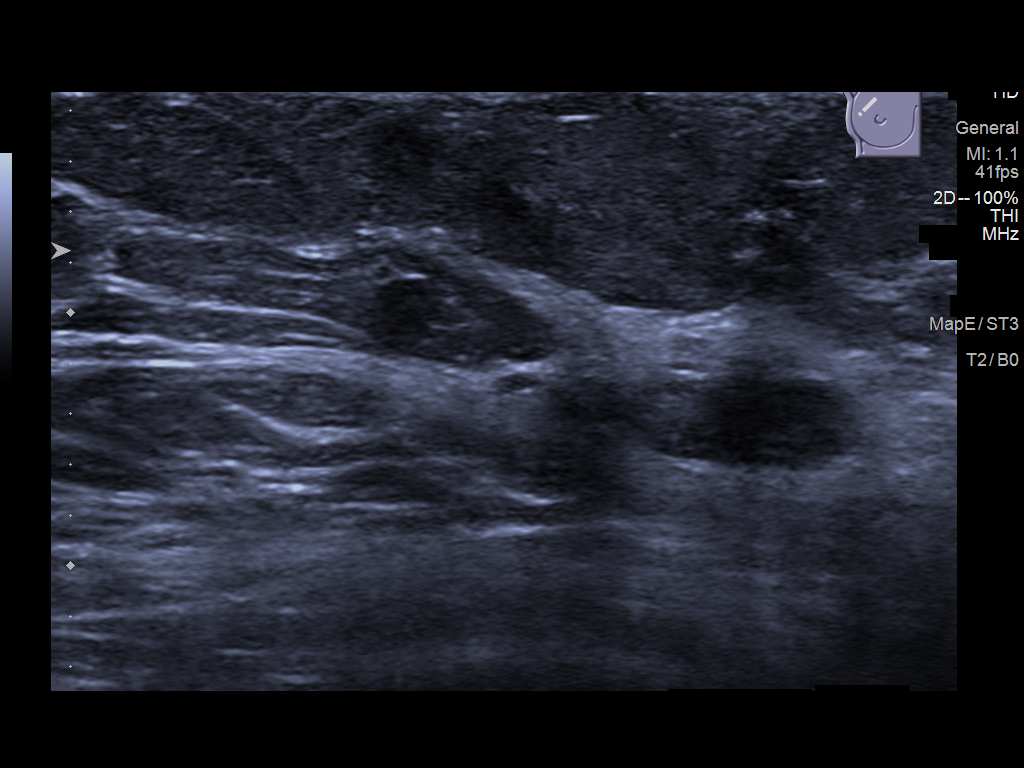
[im 2/2]
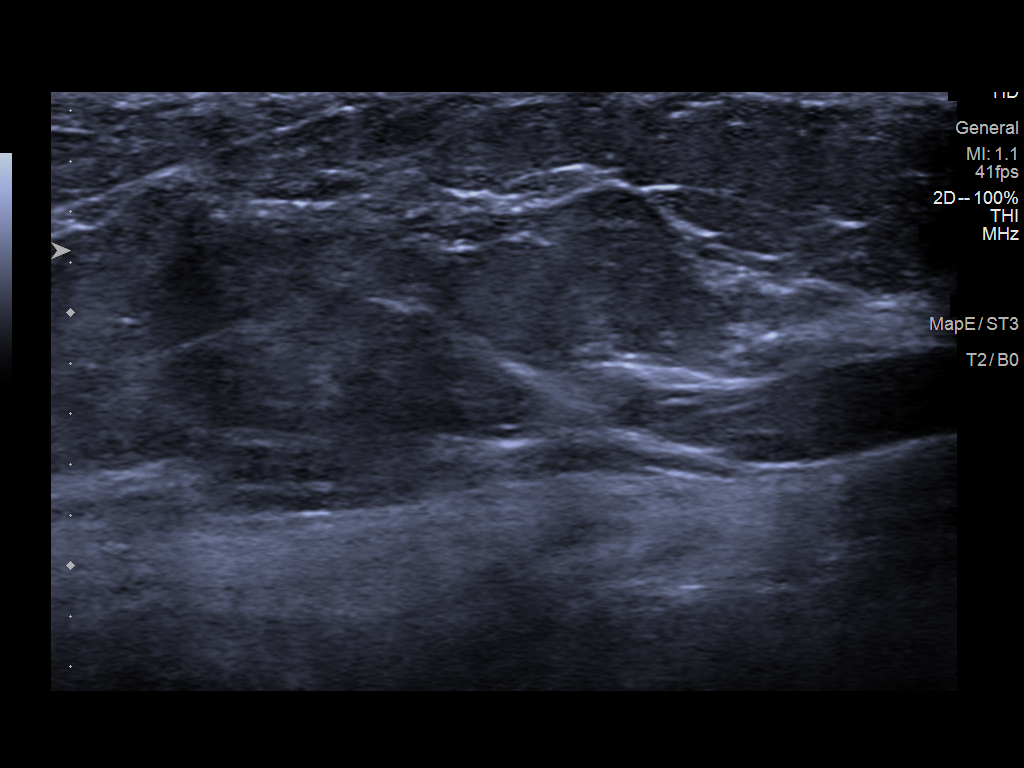

[2 of 2 positions shown; findings below may reference images not displayed]

ACR Breast Density Category c: The breast tissue is heterogeneously
dense, which may obscure small masses.
FINDINGS: No suspicious mass, malignant type microcalcifications or distortion
detected in either breast.

Mammographic images were processed with CAD.

On physical exam, I palpate a superficial soft area of thickening in
the right breast at 10 o'clock 9 cm from the nipple.

Targeted ultrasound is performed, showing normal tissue in the right
breast at 10 o'clock 9 cm from the nipple. No suspicious mass,
distortion or abnormal shadowing detected.
IMPRESSION: No evidence of malignancy in either breast.

RECOMMENDATION:
If the clinical exam remains benign/stable no follow-up is needed.

I have discussed the findings and recommendations with the patient.
Results were also provided in writing at the conclusion of the
visit. If applicable, a reminder letter will be sent to the patient
regarding the next appointment.

BI-RADS CATEGORY  1: Negative.

## 2021-04-16 NOTE — H&P (Signed)
Sheryl Gross is a 40 y.o. female [phone] here for Pre Op Consulting .   History of Present Illness: Pt presents for preop for trachelectomy due to her irregular bleeding. She saw Sheryl Gross 02/11/2021 and explained that since she had her supracervical hysterectomy 02/2019, she has 3-7 days of bleeding every month. Also, bleeding is much worse after intercourse. Sometimes, "it looks like a murder scene."    Pap 01/2021: neg/neg    Pertinent Hx: - s/p 2 SVD and 1 pTLCS with Dr. Elesa Massed  - Supracervical Hysterectomy for AUB with Dr. Elesa Massed  - Hx of AUB, and continued irregular bleeding after hysterectomy from cervix  - Family hx of cervical cancer    Past Medical History:  has a past medical history of Allergic rhinitis, Anxiety, Colitis, unspecified, Hives, and Kidney stones.  Past Surgical History:  has a past surgical history that includes Excision Ganglion Cyst Wrist Primary (Right, 07/28/2012); Cesarean section (03/19/2016); Laparoscopic Salpingectomy (Bilateral, 02/2016); and Hysterectomy. Family History: family history includes Allergies in her father; Cervical cancer in her maternal grandmother; Diabetes type II in her mother; High blood pressure (Hypertension) in her mother; Other (age of onset: 73) in her sister; Ovarian cancer in her paternal grandmother. Social History:  reports that she has never smoked. She has never used smokeless tobacco. She reports current alcohol use. She reports that she does not use drugs. OB/GYN History:  OB History     Gravida 4   Para 3   Term 3   Preterm     AB 1   Living 3     SAB 1   IAB     Ectopic     Molar     Multiple     Live Births 3          Allergies: is allergic to prevacid [lansoprazole]. Medications:   Current Outpatient Medications:    buPROPion (WELLBUTRIN XL) 150 MG XL tablet, Take 1 tablet (150 mg total) by mouth once daily, Disp: 90 tablet, Rfl: 1   cetirizine (ZYRTEC) 10 MG tablet, Take 10 mg by mouth  once daily. Reported on 08/11/2015 , Disp: , Rfl:    phentermine (ADIPEX-P) 37.5 mg tablet, Take 1 tablet (37.5 mg total) by mouth every morning before breakfast for 30 days, Disp: 30 tablet, Rfl: 0   semaglutide (RYBELSUS) 3 mg tablet, Take 1 tablet (3 mg total) by mouth once daily Do not cut, crush, or chew, Disp: 30 tablet, Rfl: 0   valACYclovir (VALTREX) 1000 MG tablet, , Disp: , Rfl:    Review of Systems: No SOB, no palpitations or chest pain, no new lower extremity edema, no nausea or vomiting or bowel or bladder complaints. See HPI for gyn specific ROS.    Exam:   BP 124/75   Ht 160 cm (5\' 3" )   Wt 80.3 kg (177 lb)   LMP 01/29/2019   BMI 31.35 kg/m    General: Patient is well-groomed, well-nourished, appears stated age in no acute distress   HEENT: head is atraumatic and normocephalic, trachea is midline, neck is supple with no palpable nodules   CV: Regular rhythm and normal heart rate, no murmur   Pulm: Clear to auscultation throughout lung fields with no wheezing, crackles, or rhonchi. No increased work of breathing   Abdomen: soft , no mass, non-tender, no rebound tenderness, no hepatomegaly   Pelvic:  Deferred - previous examined.   Impression:   The primary encounter diagnosis was Postcoital bleeding. Diagnoses of Abnormal  uterine bleeding (AUB), S/P abdominal supracervical subtotal hysterectomy, and Preop examination were also pertinent to this visit.   Plan:   - AUB, Postcoital Bleeding, S/p supracervical hysterectomy  Will plan for vaginal trachelectomy   The patient and I discussed the technical aspects of the procedure including the potential for risks and complications.  These include but are not limited to the risk of infection requiring post-operative antibiotics or further procedures.  We talked about the risk of injury to adjacent organs including bladder, bowel, ureter, blood vessels or nerves.  We talked about the need to convert to a laparoscopy or an  open incision.  We talked about the possible need for blood transfusion.  We talked about postop complications such as thromboembolic or cardiopulmonary complications.  All of her questions were answered.  Patient verbalizes understanding.  Consent form signed.  Preoperative and postoperative instructions provided. Written and verbal education provided.  No barriers to learning.     Return for Postop check.

## 2021-04-24 ENCOUNTER — Other Ambulatory Visit: Payer: Self-pay

## 2021-04-24 ENCOUNTER — Other Ambulatory Visit
Admission: RE | Admit: 2021-04-24 | Discharge: 2021-04-24 | Disposition: A | Discharge status: Home / Self Care | Payer: Medicaid Other | Source: Ambulatory Visit | Attending: Obstetrics and Gynecology | Admitting: Obstetrics and Gynecology

## 2021-04-24 HISTORY — DX: Anemia, unspecified: D64.9

## 2021-04-24 NOTE — Patient Instructions (Addendum)
Your procedure is scheduled on: 05/01/21 - Friday Report to the Registration Desk on the 1st floor of [facility]. To find out your arrival time, please call [phone] between 1PM - 3PM on: 04/30/21 - Thursday Report to [facility] for Labs /EKG on 04/27/21 at 10:30 am.  REMEMBER: Instructions that are not followed completely may result in serious medical risk, up to and including death; or upon the discretion of your surgeon and anesthesiologist your surgery may need to be rescheduled.  Do not eat food after midnight the night before surgery.  No gum chewing, lozengers or hard candies.  You may however, drink CLEAR liquids up to 2 hours before you are scheduled to arrive for your surgery. Do not drink anything within 2 hours of your scheduled arrival time.  Clear liquids include: - water  - apple juice without pulp - gatorade (not RED, PURPLE, OR BLUE) - black coffee or tea (Do NOT add milk or creamers to the coffee or tea) Do NOT drink anything that is not on this list.  TAKE THESE MEDICATIONS THE MORNING OF SURGERY WITH A SIP OF WATER: NONE  One week prior to surgery: Stop taking 04/24/21 . Stop Anti-inflammatories (NSAIDS) such as Advil, Aleve, Ibuprofen, Motrin, Naproxen, Naprosyn and Aspirin based products such as Excedrin, Goodys Powder, BC Powder.  Stop ANY OVER THE COUNTER supplements until after surgery.  You may take Tylenol if needed for pain up until the day of surgery.  No Alcohol for 24 hours before or after surgery.  No Smoking including e-cigarettes for 24 hours prior to surgery.  No chewable tobacco products for at least 6 hours prior to surgery.  No nicotine patches on the day of surgery.  Do not use any "recreational" drugs for at least a week prior to your surgery.  Please be advised that the combination of cocaine and anesthesia may have negative outcomes, up to and including death. If you test positive for cocaine, your surgery will be  cancelled.  On the morning of surgery brush your teeth with toothpaste and water, you may rinse your mouth with mouthwash if you wish. Do not swallow any toothpaste or mouthwash.  Do not wear jewelry, make-up, hairpins, clips or nail polish.  Do not wear lotions, powders, or perfumes.   Do not shave body from the neck down 48 hours prior to surgery just in case you cut yourself which could leave a site for infection.  Also, freshly shaved skin may become irritated if using the CHG soap.  Contact lenses, hearing aids and dentures may not be worn into surgery.  Do not bring valuables to the hospital. [facility] is not responsible for any missing/lost belongings or valuables.   Notify your doctor if there is any change in your medical condition (cold, fever, infection).  Wear comfortable clothing (specific to your surgery type) to the hospital.  After surgery, you can help prevent lung complications by doing breathing exercises.  Take deep breaths and cough every 1-2 hours. Your doctor may order a device called an Incentive Spirometer to help you take deep breaths. When coughing or sneezing, hold a pillow firmly against your incision with both hands. This is called "splinting." Doing this helps protect your incision. It also decreases belly discomfort.  If you are being admitted to the hospital overnight, leave your suitcase in the car. After surgery it may be brought to your room.  If you are being discharged the day of surgery, you will  not be allowed to drive home. You will need a responsible adult (18 years or older) to drive you home and stay with you that night.   If you are taking public transportation, you will need to have a responsible adult (18 years or older) with you. Please confirm with your physician that it is acceptable to use public transportation.   Please call the [facility] at [phone] if you have any questions about these  instructions.  Surgery Visitation Policy:  Patients undergoing a surgery or procedure may have one family member or support person with them as long as that person is not COVID-19 positive or experiencing its symptoms.  That person may remain in the waiting area during the procedure and may rotate out with other people.  Inpatient Visitation:    Visiting hours are 7 a.m. to 8 p.m. Up to two visitors ages 16+ are allowed at one time in a patient room. The visitors may rotate out with other people during the day. Visitors must check out when they leave, or other visitors will not be allowed. One designated support person may remain overnight. The visitor must pass COVID-19 screenings, use hand sanitizer when entering and exiting the patient's room and wear a mask at all times, including in the patient's room. Patients must also wear a mask when staff or their visitor are in the room. Masking is required regardless of vaccination status.

## 2021-04-27 ENCOUNTER — Other Ambulatory Visit: Payer: Self-pay

## 2021-04-27 ENCOUNTER — Other Ambulatory Visit
Admission: RE | Admit: 2021-04-27 | Discharge: 2021-04-27 | Disposition: A | Discharge status: Home / Self Care | Payer: Medicaid Other | Source: Ambulatory Visit | Attending: Obstetrics and Gynecology | Admitting: Obstetrics and Gynecology

## 2021-04-27 DIAGNOSIS — N939 Abnormal uterine and vaginal bleeding, unspecified: Secondary | ICD-10-CM | POA: Insufficient documentation

## 2021-04-27 LAB — TYPE AND SCREEN
ABO/RH(D): A NEG
Antibody Screen: NEGATIVE

## 2021-04-27 LAB — CBC
HCT: 40.6 % (ref 36.0–46.0)
Hemoglobin: 13.6 g/dL (ref 12.0–15.0)
MCH: 31.8 pg (ref 26.0–34.0)
MCHC: 33.5 g/dL (ref 30.0–36.0)
MCV: 94.9 fL (ref 80.0–100.0)
Platelets: 255 10*3/uL (ref 150–400)
RBC: 4.28 MIL/uL (ref 3.87–5.11)
RDW: 11.9 % (ref 11.5–15.5)
WBC: 6.6 10*3/uL (ref 4.0–10.5)
nRBC: 0 % (ref 0.0–0.2)

## 2021-04-27 LAB — BASIC METABOLIC PANEL
Anion gap: 7 (ref 5–15)
BUN: 10 mg/dL (ref 6–20)
CO2: 26 mmol/L (ref 22–32)
Calcium: 9.2 mg/dL (ref 8.9–10.3)
Chloride: 103 mmol/L (ref 98–111)
Creatinine, Ser: 0.87 mg/dL (ref 0.44–1.00)
GFR, Estimated: >60 mL/min (ref >60)
Glucose, Bld: 106 mg/dL — ABNORMAL HIGH (ref 70–99)
Potassium: 4.7 mmol/L (ref 3.5–5.1)
Sodium: 136 mmol/L (ref 135–145)

## 2021-04-30 MED ORDER — POVIDONE-IODINE 10 % EX SWAB: 2.0000 "application " | Freq: Once | CUTANEOUS | Status: DC

## 2021-04-30 MED ORDER — FAMOTIDINE 20 MG PO TABS: 20.0000 mg | ORAL_TABLET | Freq: Once | ORAL | Status: AC

## 2021-04-30 MED ORDER — ACETAMINOPHEN 500 MG PO TABS: 1000.0000 mg | ORAL_TABLET | ORAL | Status: AC

## 2021-04-30 MED ORDER — ORAL CARE MOUTH RINSE: 15.0000 mL | Freq: Once | OROMUCOSAL | Status: AC

## 2021-04-30 MED ORDER — LACTATED RINGERS IV SOLN: INTRAVENOUS | Status: DC

## 2021-04-30 MED ORDER — CEFAZOLIN SODIUM-DEXTROSE 2-4 GM/100ML-% IV SOLN
2.0000 g | INTRAVENOUS | Status: AC
  Administered 2021-05-01: 2 g via INTRAVENOUS

## 2021-04-30 MED ORDER — CHLORHEXIDINE GLUCONATE 0.12 % MT SOLN: 15.0000 mL | Freq: Once | OROMUCOSAL | Status: AC

## 2021-04-30 MED ORDER — GABAPENTIN 300 MG PO CAPS: 300.0000 mg | ORAL_CAPSULE | ORAL | Status: AC

## 2021-05-01 ENCOUNTER — Other Ambulatory Visit: Payer: Self-pay

## 2021-05-01 ENCOUNTER — Ambulatory Visit: Payer: Medicaid Other | Admitting: Anesthesiology

## 2021-05-01 ENCOUNTER — Ambulatory Visit
Admission: RE | Admit: 2021-05-01 | Discharge: 2021-05-01 | Disposition: A | Discharge status: Home / Self Care | Payer: Medicaid Other | Attending: Obstetrics and Gynecology | Admitting: Obstetrics and Gynecology

## 2021-05-01 ENCOUNTER — Encounter
Admission: RE | Disposition: A | Discharge status: Home / Self Care | Payer: Self-pay | Source: Home / Self Care | Attending: Obstetrics and Gynecology

## 2021-05-01 ENCOUNTER — Encounter: Payer: Self-pay | Admitting: Obstetrics and Gynecology

## 2021-05-01 DIAGNOSIS — N930 Postcoital and contact bleeding: Secondary | ICD-10-CM | POA: Insufficient documentation

## 2021-05-01 DIAGNOSIS — Z6830 Body mass index (BMI) 30.0-30.9, adult: Secondary | ICD-10-CM | POA: Diagnosis not present

## 2021-05-01 DIAGNOSIS — Z8049 Family history of malignant neoplasm of other genital organs: Secondary | ICD-10-CM | POA: Diagnosis not present

## 2021-05-01 DIAGNOSIS — Z90710 Acquired absence of both cervix and uterus: Secondary | ICD-10-CM | POA: Diagnosis not present

## 2021-05-01 DIAGNOSIS — N938 Other specified abnormal uterine and vaginal bleeding: Secondary | ICD-10-CM | POA: Diagnosis not present

## 2021-05-01 DIAGNOSIS — E669 Obesity, unspecified: Secondary | ICD-10-CM | POA: Diagnosis not present

## 2021-05-01 DIAGNOSIS — F17210 Nicotine dependence, cigarettes, uncomplicated: Secondary | ICD-10-CM | POA: Diagnosis not present

## 2021-05-01 DIAGNOSIS — K660 Peritoneal adhesions (postprocedural) (postinfection): Secondary | ICD-10-CM | POA: Diagnosis not present

## 2021-05-01 DIAGNOSIS — N939 Abnormal uterine and vaginal bleeding, unspecified: Secondary | ICD-10-CM

## 2021-05-01 HISTORY — PX: LAPAROSCOPY: SHX197

## 2021-05-01 HISTORY — PX: TRACHELECTOMY: SHX6586

## 2021-05-01 SURGERY — TRACHELECTOMY
Anesthesia: General

## 2021-05-01 MED ORDER — KETOROLAC TROMETHAMINE 30 MG/ML IJ SOLN: 30.0000 mg | Freq: Once | INTRAMUSCULAR | Status: AC

## 2021-05-01 MED ORDER — ACETAMINOPHEN 500 MG PO TABS
ORAL_TABLET | ORAL | Status: AC
  Administered 2021-05-01: 1000 mg via ORAL
  Filled 2021-05-01: qty 2

## 2021-05-01 MED ORDER — OXYCODONE HCL 5 MG PO TABS
5.0000 mg | ORAL_TABLET | Freq: Once | ORAL | Status: AC
  Administered 2021-05-01: 5 mg via ORAL

## 2021-05-01 MED ORDER — MIDAZOLAM HCL 2 MG/2ML IJ SOLN
INTRAMUSCULAR | Status: DC | PRN
  Administered 2021-05-01: 2 mg via INTRAVENOUS

## 2021-05-01 MED ORDER — MEPERIDINE HCL 25 MG/ML IJ SOLN: 6.2500 mg | INTRAMUSCULAR | Status: DC | PRN

## 2021-05-01 MED ORDER — OXYCODONE HCL 5 MG/5ML PO SOLN: 5.0000 mg | Freq: Once | ORAL | Status: AC | PRN

## 2021-05-01 MED ORDER — HEMOSTATIC AGENTS (NO CHARGE) OPTIME
TOPICAL | Status: DC | PRN
  Administered 2021-05-01: 1 via TOPICAL

## 2021-05-01 MED ORDER — PHENYLEPHRINE HCL (PRESSORS) 10 MG/ML IV SOLN
INTRAVENOUS | Status: DC | PRN
  Administered 2021-05-01: 80 ug via INTRAVENOUS

## 2021-05-01 MED ORDER — LIDOCAINE HCL (PF) 2 % IJ SOLN
INTRAMUSCULAR | Status: AC
  Filled 2021-05-01: qty 5

## 2021-05-01 MED ORDER — CHLORHEXIDINE GLUCONATE 0.12 % MT SOLN
OROMUCOSAL | Status: AC
  Administered 2021-05-01: 15 mL via OROMUCOSAL
  Filled 2021-05-01: qty 15

## 2021-05-01 MED ORDER — GABAPENTIN 300 MG PO CAPS
ORAL_CAPSULE | ORAL | Status: AC
  Administered 2021-05-01: 300 mg via ORAL
  Filled 2021-05-01: qty 1

## 2021-05-01 MED ORDER — DOCUSATE SODIUM 100 MG PO CAPS: 100.0000 mg | ORAL_CAPSULE | Freq: Two times a day (BID) | ORAL | 0 refills | Status: DC

## 2021-05-01 MED ORDER — ROCURONIUM BROMIDE 10 MG/ML (PF) SYRINGE
PREFILLED_SYRINGE | INTRAVENOUS | Status: AC
  Filled 2021-05-01: qty 10

## 2021-05-01 MED ORDER — FENTANYL CITRATE (PF) 100 MCG/2ML IJ SOLN
INTRAMUSCULAR | Status: AC
  Administered 2021-05-01: 50 ug via INTRAVENOUS
  Filled 2021-05-01: qty 2

## 2021-05-01 MED ORDER — IBUPROFEN 800 MG PO TABS: 800.0000 mg | ORAL_TABLET | Freq: Three times a day (TID) | ORAL | 1 refills | Status: AC

## 2021-05-01 MED ORDER — GABAPENTIN 800 MG PO TABS: 800.0000 mg | ORAL_TABLET | Freq: Every day | ORAL | 0 refills | Status: DC

## 2021-05-01 MED ORDER — OXYCODONE HCL 5 MG PO TABS
ORAL_TABLET | ORAL | Status: AC
  Filled 2021-05-01: qty 1

## 2021-05-01 MED ORDER — PROPOFOL 10 MG/ML IV BOLUS
INTRAVENOUS | Status: DC | PRN
  Administered 2021-05-01: 150 mg via INTRAVENOUS

## 2021-05-01 MED ORDER — ROCURONIUM BROMIDE 100 MG/10ML IV SOLN
INTRAVENOUS | Status: DC | PRN
Start: 2021-05-01 — End: 2021-05-01
  Administered 2021-05-01: 40 mg via INTRAVENOUS
  Administered 2021-05-01: 10 mg via INTRAVENOUS
  Administered 2021-05-01: 20 mg via INTRAVENOUS

## 2021-05-01 MED ORDER — LIDOCAINE HCL (CARDIAC) PF 100 MG/5ML IV SOSY
PREFILLED_SYRINGE | INTRAVENOUS | Status: DC | PRN
  Administered 2021-05-01: 60 mg via INTRAVENOUS

## 2021-05-01 MED ORDER — DEXAMETHASONE SODIUM PHOSPHATE 10 MG/ML IJ SOLN
INTRAMUSCULAR | Status: DC | PRN
  Administered 2021-05-01: 10 mg via INTRAVENOUS

## 2021-05-01 MED ORDER — MIDAZOLAM HCL 2 MG/2ML IJ SOLN
INTRAMUSCULAR | Status: AC
  Filled 2021-05-01: qty 2

## 2021-05-01 MED ORDER — OXYCODONE HCL 5 MG PO TABS: 5.0000 mg | ORAL_TABLET | Freq: Once | ORAL | Status: AC | PRN

## 2021-05-01 MED ORDER — BUPIVACAINE HCL (PF) 0.5 % IJ SOLN
INTRAMUSCULAR | Status: AC
  Filled 2021-05-01: qty 30

## 2021-05-01 MED ORDER — PROMETHAZINE HCL 25 MG/ML IJ SOLN: 6.2500 mg | INTRAMUSCULAR | Status: DC | PRN

## 2021-05-01 MED ORDER — OXYCODONE HCL 5 MG PO TABS: 5.0000 mg | ORAL_TABLET | ORAL | 0 refills | Status: DC | PRN

## 2021-05-01 MED ORDER — VASOPRESSIN 20 UNIT/ML IV SOLN
INTRAVENOUS | Status: AC
  Filled 2021-05-01: qty 1

## 2021-05-01 MED ORDER — CEFAZOLIN SODIUM-DEXTROSE 2-4 GM/100ML-% IV SOLN
INTRAVENOUS | Status: AC
  Filled 2021-05-01: qty 100

## 2021-05-01 MED ORDER — PROPOFOL 10 MG/ML IV BOLUS
INTRAVENOUS | Status: AC
  Filled 2021-05-01: qty 20

## 2021-05-01 MED ORDER — SUGAMMADEX SODIUM 200 MG/2ML IV SOLN
INTRAVENOUS | Status: DC | PRN
Start: 2021-05-01 — End: 2021-05-01
  Administered 2021-05-01: 200 mg via INTRAVENOUS

## 2021-05-01 MED ORDER — LIDOCAINE-EPINEPHRINE 1 %-1:100000 IJ SOLN
INTRAMUSCULAR | Status: AC
  Filled 2021-05-01: qty 1

## 2021-05-01 MED ORDER — ONDANSETRON HCL 4 MG/2ML IJ SOLN
INTRAMUSCULAR | Status: DC | PRN
  Administered 2021-05-01: 4 mg via INTRAVENOUS

## 2021-05-01 MED ORDER — FENTANYL CITRATE (PF) 100 MCG/2ML IJ SOLN
25.0000 ug | INTRAMUSCULAR | Status: DC | PRN
  Administered 2021-05-01: 50 ug via INTRAVENOUS

## 2021-05-01 MED ORDER — SODIUM CHLORIDE (PF) 0.9 % IJ SOLN
INTRAMUSCULAR | Status: AC
  Filled 2021-05-01: qty 50

## 2021-05-01 MED ORDER — LIDOCAINE-EPINEPHRINE 1 %-1:100000 IJ SOLN
INTRAMUSCULAR | Status: DC | PRN
  Administered 2021-05-01: 20 mL

## 2021-05-01 MED ORDER — ACETAMINOPHEN 500 MG PO TABS: 1000.0000 mg | ORAL_TABLET | Freq: Four times a day (QID) | ORAL | 0 refills | Status: AC

## 2021-05-01 MED ORDER — OXYCODONE HCL 5 MG PO TABS
ORAL_TABLET | ORAL | Status: AC
  Administered 2021-05-01: 5 mg via ORAL
  Filled 2021-05-01: qty 1

## 2021-05-01 MED ORDER — FAMOTIDINE 20 MG PO TABS
ORAL_TABLET | ORAL | Status: AC
  Administered 2021-05-01: 20 mg via ORAL
  Filled 2021-05-01: qty 1

## 2021-05-01 MED ORDER — FENTANYL CITRATE (PF) 100 MCG/2ML IJ SOLN
INTRAMUSCULAR | Status: DC | PRN
  Administered 2021-05-01 (×2): 50 ug via INTRAVENOUS

## 2021-05-01 MED ORDER — 0.9 % SODIUM CHLORIDE (POUR BTL) OPTIME
TOPICAL | Status: DC | PRN
  Administered 2021-05-01: 1000 mL

## 2021-05-01 MED ORDER — KETOROLAC TROMETHAMINE 30 MG/ML IJ SOLN
INTRAMUSCULAR | Status: AC
  Administered 2021-05-01: 30 mg via INTRAVENOUS
  Filled 2021-05-01: qty 1

## 2021-05-01 MED ORDER — DEXMEDETOMIDINE (PRECEDEX) IN NS 20 MCG/5ML (4 MCG/ML) IV SYRINGE
PREFILLED_SYRINGE | INTRAVENOUS | Status: DC | PRN
  Administered 2021-05-01: 12 ug via INTRAVENOUS
  Administered 2021-05-01: 8 ug via INTRAVENOUS

## 2021-05-01 MED ORDER — FENTANYL CITRATE (PF) 100 MCG/2ML IJ SOLN
INTRAMUSCULAR | Status: AC
  Filled 2021-05-01: qty 2

## 2021-05-01 SURGICAL SUPPLY — 47 items
BAG DRN RND TRDRP ANRFLXCHMBR (UROLOGICAL SUPPLIES) ×1
BAG URINE DRAIN 2000ML AR STRL (UROLOGICAL SUPPLIES) ×2 IMPLANT
BLADE SURG SZ10 CARB STEEL (BLADE) ×2 IMPLANT
CATH FOLEY 2WAY  5CC 16FR (CATHETERS) ×1
CATH FOLEY 2WAY 5CC 16FR (CATHETERS) ×1
CATH URTH 16FR FL 2W BLN LF (CATHETERS) ×1 IMPLANT
DEFOGGER ANTIFOG KIT (MISCELLANEOUS) ×2 IMPLANT
DRAPE 3/4 80X56 (DRAPES) ×2 IMPLANT
DRAPE PERI LITHO V/GYN (MISCELLANEOUS) ×2 IMPLANT
DRAPE SURG 17X11 SM STRL (DRAPES) ×2 IMPLANT
DRAPE UNDER BUTTOCK W/FLU (DRAPES) ×2 IMPLANT
DRAPE UTILITY 15X26 TOWEL STRL (DRAPES) ×2 IMPLANT
ELECT REM PT RETURN 9FT ADLT (ELECTROSURGICAL) ×2
ELECTRODE REM PT RTRN 9FT ADLT (ELECTROSURGICAL) ×1 IMPLANT
GAUZE 4X4 16PLY ~~LOC~~+RFID DBL (SPONGE) ×2 IMPLANT
GLOVE SURG ENC MOIS LTX SZ7 (GLOVE) ×2 IMPLANT
GLOVE SURG UNDER LTX SZ7.5 (GLOVE) ×2 IMPLANT
GOWN STRL REUS W/ TWL LRG LVL3 (GOWN DISPOSABLE) ×2 IMPLANT
GOWN STRL REUS W/ TWL XL LVL3 (GOWN DISPOSABLE) ×1 IMPLANT
GOWN STRL REUS W/TWL LRG LVL3 (GOWN DISPOSABLE) ×4
GOWN STRL REUS W/TWL XL LVL3 (GOWN DISPOSABLE) ×2
HEMOSTAT ARISTA ABSORB 3G PWDR (HEMOSTASIS) ×2 IMPLANT
IRRIGATION STRYKERFLOW (MISCELLANEOUS) ×1 IMPLANT
IRRIGATOR STRYKERFLOW (MISCELLANEOUS) ×2
KIT TURNOVER CYSTO (KITS) ×2 IMPLANT
MANIFOLD NEPTUNE II (INSTRUMENTS) ×2 IMPLANT
NDL HPO THNWL 1X22GA REG BVL (NEEDLE) ×1 IMPLANT
NDL SAFETY ECLIPSE 18X1.5 (NEEDLE) ×1 IMPLANT
NEEDLE HYPO 18GX1.5 SHARP (NEEDLE) ×2
NEEDLE HYPO 22GX1.5 SAFETY (NEEDLE) ×2 IMPLANT
NEEDLE SAFETY 22GX1 (NEEDLE) ×2
NS IRRIG 500ML POUR BTL (IV SOLUTION) ×2 IMPLANT
PACK BASIN MINOR ARMC (MISCELLANEOUS) ×2 IMPLANT
PAD OB MATERNITY 4.3X12.25 (PERSONAL CARE ITEMS) ×2 IMPLANT
PAD PREP 24X41 OB/GYN DISP (PERSONAL CARE ITEMS) ×2 IMPLANT
SCRUB EXIDINE 4% CHG 4OZ (MISCELLANEOUS) ×2 IMPLANT
SHEARS HARMONIC ACE PLUS 36CM (ENDOMECHANICALS) ×2 IMPLANT
SLEEVE ENDOPATH XCEL 5M (ENDOMECHANICALS) ×2 IMPLANT
SOL PREP PROV IODINE SCRUB 4OZ (MISCELLANEOUS) ×2 IMPLANT
SUT VIC AB 0 CT1 27 (SUTURE) ×4
SUT VIC AB 0 CT1 27XCR 8 STRN (SUTURE) ×2 IMPLANT
SUT VIC AB 0 CT1 36 (SUTURE) ×2 IMPLANT
SYR 10ML LL (SYRINGE) ×2 IMPLANT
SYR 30ML LL (SYRINGE) ×2 IMPLANT
SYR CONTROL 10ML LL (SYRINGE) ×2 IMPLANT
WATER STERILE IRR 1000ML POUR (IV SOLUTION) ×2 IMPLANT
WATER STERILE IRR 500ML POUR (IV SOLUTION) ×2 IMPLANT

## 2021-05-01 NOTE — Anesthesia Preprocedure Evaluation (Signed)
Anesthesia Evaluation  Patient identified by MRN, date of birth, ID band Patient awake    Reviewed: Allergy & Precautions, NPO status , Patient's Chart, lab work & pertinent test results  History of Anesthesia Complications Negative for: history of anesthetic complications  Airway Mallampati: II  TM Distance: >3 FB Neck ROM: Full    Dental no notable dental hx.    Pulmonary neg sleep apnea, neg COPD, Current Smoker (ecigarettes)Patient did not abstain from smoking., former smoker,    breath sounds clear to auscultation- rhonchi (-) wheezing      Cardiovascular Exercise Tolerance: Good (-) hypertension(-) CAD, (-) Past MI, (-) Cardiac Stents and (-) CABG  Rhythm:Regular Rate:Normal - Systolic murmurs and - Diastolic murmurs    Neuro/Psych  Headaches, neg Seizures PSYCHIATRIC DISORDERS Anxiety Depression    GI/Hepatic negative GI ROS, Neg liver ROS,   Endo/Other  negative endocrine ROSneg diabetes  Renal/GU negative Renal ROS     Musculoskeletal negative musculoskeletal ROS (+)   Abdominal (+) + obese,   Peds  Hematology  (+) anemia ,   Anesthesia Other Findings Past Medical History: No date: Anemia No date: Anxiety No date: Depression No date: History of kidney stones No date: Spinal headache   Reproductive/Obstetrics                             Anesthesia Physical Anesthesia Plan  ASA: 2  Anesthesia Plan: General   Post-op Pain Management:    Induction: Intravenous  PONV Risk Score and Plan: 2 and Dexamethasone, Ondansetron and Midazolam  Airway Management Planned: Oral ETT  Additional Equipment:   Intra-op Plan:   Post-operative Plan: Extubation in OR  Informed Consent: I have reviewed the patients History and Physical, chart, labs and discussed the procedure including the risks, benefits and alternatives for the proposed anesthesia with the patient or authorized  representative who has indicated his/her understanding and acceptance.     Dental advisory given  Plan Discussed with: CRNA and Anesthesiologist  Anesthesia Plan Comments:         Anesthesia Quick Evaluation

## 2021-05-01 NOTE — Op Note (Signed)
Sheryl Gross PROCEDURE DATE: 05/01/2021  PREOPERATIVE DIAGNOSIS:   Abnormal vaginal bleeding, abnormal cervical bleeding POSTOPERATIVE DIAGNOSIS:   Same SURGEON:   [name], M.D. ASSISTANT: Suzy Bouchard, M.D. OPERATION:  Laparoscopic lysis of adhesions, Vaginal Trachelectomy ANESTHESIA:  General endotracheal. Anesthesiologist: Anesthesiologist: Alver Fisher, MD; Karleen Hampshire, MD CRNA: Stormy Fabian, CRNA; Omer Jack, CRNA  INDICATIONS: The patient is a 40 y.o. [id number] with history of a prior supracervical hysterectomy who presented to the office with significant cyclic bleeding and significant bleeding after intercourse.  She requested surgical resolution of these problems. The patient made a decision to undergo definite surgical treatment. On the preoperative visit, the risks, benefits, indications, and alternatives of the procedure were reviewed with the patient.  On the day of surgery, the risks of surgery were again discussed with the patient including but not limited to: bleeding which may require transfusion or reoperation; infection which may require antibiotics; injury to bowel, bladder, ureters or other surrounding organs; need for additional procedures; thromboembolic phenomenon, incisional problems and other postoperative/anesthesia complications. Written informed consent was obtained.    OPERATIVE FINDINGS: Bowels adherent to the apex of the cervix. Normal cervix  ESTIMATED BLOOD LOSS: 10 ml FLUIDS:  700 ml of Lactated Ringers URINE OUTPUT:  150 ml of clear yellow urine. SPECIMENS:  cervix sent to pathology COMPLICATIONS:  None immediate.  DESCRIPTION OF PROCEDURE:  The patient received prophylactic intravenous antibiotics and had sequential compression devices applied to her lower extremities while in the preoperative area.    She was taken to the operating room, where she was identified by name and birth date. General anesthesia was  administered and was found to be adequate.  She was placed in the dorsal lithotomy position, and was prepped and draped in a sterile manner.  A formal time out procedure was performed with all team members present and in agreement. A Foley catheter was inserted into her bladder and attached to gravity drainage. Of note, all sutures used in this case were 0 Vicryl unless otherwise noted.    Attention was turned to the abdomen where an umbilical incision was made with the scalpel.  The Optiview 5-mm trocar and sleeve were then advanced without difficulty with the laparoscope under direct visualization into the abdomen.  The abdomen was then insufflated with carbon dioxide gas and adequate pneumoperitoneum was obtained.   A detailed survey of the patient's pelvis and abdomen revealed the findings as mentioned above. Two additional 93mm trocars were placed in the bilateral lower quadrants under direct visualization.  The abdomen and pelvis were examined as above.  The bowel adhesions were taken down using sharp cautery with the harmonic scalpel.  Once the operative field was clear, attention was turned to the pelvis  A weighted speculum was placed in the vagina, and the anterior and posterior lips of the cervix were grasped bilaterally with thyroid tenaculums.  The cervix was then injected circumferentially with 0.25% Marcaine with epinephrine solution to maintain hemostasis.  The posterior cul-de-sac was entered sharply in the midline.   A long weighted speculum was inserted into the posterior cul-de-sac. The cervix was circumferentially incised using electrocautery, and the bladder was dissected off the pubocervical fascia anteriorly with sharp and careful blunt dissection without complication.  The anterior cul-de-sac was then entered sharply without difficulty and the bladder retracted out of the operative field behind a retractor. The Heaney clamp was then used to clamp the uterosacral ligaments on either  side.  They were then cut  and sutured ligated with 0 Vicryl, and the ligated uterosacral ligaments were transfixed to the ipsilateral vaginal epithelium to further support the vagina and provide hemostasis.   The cervix  was then delivered via the posterior cul-de-sac and sent to pathology.   After completion of the hysterectomy, all pedicles were examined hemostasis was confirmed.  The vaginal cuff was then closed with in a running locked fashion with care given to incorporate the uterosacral pedicles bilaterally, which were also tied in the midline.  All instruments were then removed from the pelvis.  Attention was returned to the abdomen, and hemostasis was assured. All incisions were closed with 4-0 Vicryl and Dermabond.   The patient tolerated the procedure well.  All instruments, needles, and sponge counts were correct x 2. The patient was taken to the recovery room in stable condition.    Cline Cools, MD, MPH

## 2021-05-01 NOTE — Interval H&P Note (Signed)
History and Physical Interval Note:  05/01/2021 9:50 AM  Sheryl Gross  has presented today for surgery, with the diagnosis of abnormal uterine bleeding.  The various methods of treatment have been discussed with the patient and family. After consideration of risks, benefits and other options for treatment, the patient has consented to  Procedure(s): VAGINAL TRACHELECTOMY (N/A) with diagnostic laparoscopy as a surgical intervention.  The patient's history has been reviewed, patient examined, no change in status, stable for surgery.  I have reviewed the patient's chart and labs.  Questions were answered to the patient's satisfaction.     [name]

## 2021-05-01 NOTE — Anesthesia Procedure Notes (Signed)
Procedure Name: Intubation Date/Time: 05/01/2021 10:24 AM Performed by: Jonna Clark, CRNA Pre-anesthesia Checklist: Patient identified, Patient being monitored, Timeout performed, Emergency Drugs available and Suction available Patient Re-evaluated:Patient Re-evaluated prior to induction Oxygen Delivery Method: Circle system utilized Preoxygenation: Pre-oxygenation with 100% oxygen Induction Type: IV induction Ventilation: Mask ventilation without difficulty Laryngoscope Size: Mac and 3 Grade View: Grade I Tube type: Oral Tube size: 7.0 mm Number of attempts: 1 Placement Confirmation: ETT inserted through vocal cords under direct vision, positive ETCO2 and breath sounds checked- equal and bilateral Secured at: 21 cm Tube secured with: Tape Dental Injury: Teeth and Oropharynx as per pre-operative assessment

## 2021-05-01 NOTE — Transfer of Care (Signed)
Immediate Anesthesia Transfer of Care Note  Patient: Sheryl Gross  Procedure(s) Performed: VAGINAL TRACHELECTOMY LAPAROSCOPY DIAGNOSTIC WITH LYSIS OF ADHESIONS  Patient Location: PACU  Anesthesia Type:General  Level of Consciousness: drowsy and patient cooperative  Airway & Oxygen Therapy: Patient Spontanous Breathing and Patient connected to face mask oxygen  Post-op Assessment: Report given to RN and Post -op Vital signs reviewed and stable  Post vital signs: Reviewed and stable  Last Vitals:  Vitals Value Taken Time  BP 97/65 05/01/21 1235  Temp 36.1 C 05/01/21 1235  Pulse 65 05/01/21 1239  Resp 20 05/01/21 1239  SpO2 100 % 05/01/21 1239  Vitals shown include unvalidated device data.  Last Pain:  Vitals:   05/01/21 0855  TempSrc: Temporal  PainSc: 0-No pain         Complications: No notable events documented.

## 2021-05-01 NOTE — Anesthesia Postprocedure Evaluation (Signed)
Anesthesia Post Note  Patient: Sheryl Gross  Procedure(s) Performed: VAGINAL TRACHELECTOMY LAPAROSCOPY DIAGNOSTIC WITH LYSIS OF ADHESIONS  Patient location during evaluation: PACU Anesthesia Type: General Level of consciousness: awake and alert Pain management: pain level controlled Vital Signs Assessment: post-procedure vital signs reviewed and stable Respiratory status: spontaneous breathing, nonlabored ventilation, respiratory function stable and patient connected to nasal cannula oxygen Cardiovascular status: blood pressure returned to baseline and stable Postop Assessment: no apparent nausea or vomiting Anesthetic complications: no   No notable events documented.   Last Vitals:  Vitals:   05/01/21 1408 05/01/21 1507  BP: 135/81 118/71  Pulse: 87 81  Resp: 18   Temp: 36.5 C (!) 36.3 C  SpO2: 100% 100%    Last Pain:  Vitals:   05/01/21 1507  TempSrc: Temporal  PainSc: 4                  Karleen Hampshire

## 2021-05-01 NOTE — Discharge Instructions (Addendum)
For the next three days, take ibuprofen and acetaminophen on a schedule, every 8 hours. You can take them together or you can intersperse them, and take one every four hours. I also gave you gabapentin for nighttime, to help you sleep and also to control pain. Take gabapentin medicines at night for at least the next 3 nights. You also have a narcotic, oxycodone, to take as needed if the above medicines don't help.  Postop constipation is a major cause of pain. Stay well hydrated, walk as you tolerate, and take over the counter senna as well as stool softeners if you need them.   Signs and Symptoms to Report Call our office at [phone] if you have any of the following.   Fever over 100.4 degrees or higher  Severe stomach pain not relieved with pain medications  Bright red bleeding that's heavier than a period that does not slow with rest  To go the bathroom a lot (frequency), you can't hold your urine (urgency), or it hurts when you empty your bladder (urinate)  Chest pain  Shortness of breath  Pain in the calves of your legs  Severe nausea and vomiting not relieved with anti-nausea medications  Signs of infection around your wounds, such as redness, hot to touch, swelling, green/yellow drainage (like pus), bad smelling discharge  Any concerns  What You Can Expect after Surgery  You may see some pink tinged, bloody fluid and bruising around the wound. This is normal.  You may notice shoulder and neck pain. This is caused by the gas used during surgery to expand your abdomen so your surgeon could get to the uterus easier.  You may have a sore throat because of the tube in your mouth during general anesthesia. This will go away in 2 to 3 days.  You may have some stomach cramps.  You may notice spotting on your panties.  You may have pain around the incision sites.   Activities after Your Discharge Follow these guidelines to help speed your recovery at home:  Do the coughing and deep  breathing as you did in the hospital for 2 weeks. Use the small blue breathing device, called the incentive spirometer for 2 weeks.  Don't drive if you are in pain or taking narcotic pain medicine. You may drive when you can safely slam on the brakes, turn the wheel forcefully, and rotate your torso comfortably. This is typically 1-2 weeks. Practice in a parking lot or side street prior to attempting to drive regularly.   Ask others to help with household chores for 4 weeks.  Do not lift anything heavier that 10 pounds for 4-6 weeks. This includes pets, children, and groceries.  Don't do strenuous activities, exercises, or sports like vacuuming, tennis, squash, etc. until your doctor says it is safe to do so. ---If you had a hysterectomy (abdominal, laparoscopic, or vaginal) do not have intercourse for 8-10 weeks.   Walk as you feel able. Rest often since it may take two or three weeks for your energy level to return to normal.   You may climb stairs  Avoid constipation:   -Eat fruits, vegetables, and whole grains. Eat small meals as your appetite will take time to return to normal.   -Drink 6 to 8 glasses of water each day unless your doctor has told you to limit your fluids.   -Use a laxative or stool softener as needed if constipation becomes a problem. You may take Miralax, metamucil, Citrucil, Colace, Senekot,  FiberCon, etc. If this does not relieve the constipation, try two tablespoons of Milk Of Magnesia every 8 hours until your bowels move.   You may shower. Gently wash the wounds with a mild soap and water. Pat dry.  Do not get in a hot tub, swimming pool, etc. until your doctor agrees.  Do not use lotions, oils, powders on the wounds.  Do not douche, use tampons, or have sex until your doctor says it is okay.  Take your pain medicine when you need it. The medicine may not work as well if the pain is bad.  Take the medicines you were taking before surgery. Other medications you will need  are pain medications (Norco or Percocet) and nausea medications (Zofran).     Here is a helpful article from the website http://mitchell.org/, regarding constipation  Here are reasons why constipation occurs after surgery: 1) During the operation and in the recovery room, most people are given opioid pain medication, primarily through an IV, to treat moderate or severe pain. Intravenous opioids include morphine, Dilaudid and fentanyl. After surgery, patients are often prescribed opioid pain medication to take by mouth at home, including codeine, Vicodin, Norco, and Percocet. All of these medications cause constipation by slowing down the movement of your intestine. 2) Changes in your diet before surgery can be another culprit. It is common to get specific instructions to change how you normally eat or drink before your surgery, like only having liquids the day before or not having anything to eat or drink after midnight the night before surgery. For this reason, temporary dehydration may occur. This, along with not eating or only having liquids, means that you are getting less fiber than usual. Both these factors contribute to constipation. 3) Changes in your diet after surgery can also contribute to the problem. Although many people don't have dietary restrictions after operations, being under anesthesia can make you lose your appetite for several hours and maybe even days. Some people can even have nausea or vomiting. Not eating or drinking normally means that you are not getting enough fiber and you can get dehydrated, both leading to constipation. 4) Lying in a bed more than usual--which happens before, during and after surgery--combined with the medications and diet changes, all work together to slow down your colon and make your poop turn to rock.  No one likes to be constipated.  Let's face it, it's not a pleasant feeling when you don't poop for days, then strain on the toilet to finally pass something  large enough to cause damage. An ounce of prevention is worth a pound of cure, so: Assume you will be constipated. Plan and prepare accordingly. Post-surgery is one of those unique situations where the temporary use of laxatives can make a world of difference. Always consult with your doctor, and recognize that if you wait several days after surgery to take a laxative, the constipation might be too severe for these over-the-counter options. It is always important to discuss all medications you plan on taking with your doctor. Ask your doctor if you can start the laxative immediately after surgery. *  Here are go-to post-surgery laxatives: Senna: Senna is an herb that acts as a "stimulant laxative," meaning it increases the activity of the intestine to cause you to have a bowel movement. It comes in many forms, but senna pills are easy to take and are sold over the counter at almost all pharmacies. Since opioid pain medications slow down the activity of  the intestine, it makes sense to take a medication to help reverse that side effect. Long-term use of a stimulant laxative is not a good idea since it can make your colon "lazy" and not function properly; however, temporary use immediately after surgery is acceptable. In general, if you are able to eat a normal diet, taking senna soon after surgery works the best. Senna usually works within hours to produce a bowel movement, but this is less predictable when you are taking different medications after surgery. Try not to wait several days to start taking senna, as often it is too late by then. Just like with all medications or supplements, check with your doctor before starting new treatment.   Magnesium: Magnesium is an important mineral that our body needs. We get magnesium from some foods that we eat, especially foods that are high in fiber such as broccoli, almonds and whole grains. There are also magnesium-based medications used to treat constipation  including milk of magnesia (magnesium hydroxide), magnesium citrate and magnesium oxide. They work by drawing water into the intestine, putting it into the class of "osmotic" laxatives. Magnesium products in low doses appear to be safe, but if taken in very large doses, can lead to problems such as irregular heartbeat, low blood pressure and even death. It can also affect other medications you might be taking, therefore it is important to discuss using magnesium with your physician and pharmacist before initiating therapy. Most over-the-counter magnesium laxatives work very well to help with the constipation related to surgery, but sometimes they work too well and lead to diarrhea. Make sure you are somewhere with easy access to a bathroom, just in case.   Bisacodyl: Bisacodyl (generic name) is sold under brand names such as Dulcolax. Much like senna, it is a "stimulant laxative," meaning it makes your intestines move more quickly to push out the stool. This is another good choice to start taking as soon as your doctor says you can take a laxative after surgery. It comes in pill form and as a suppository, which is a good choice for people who cannot or are not allowed to swallow pills. Studies have shown that it works as a laxative, but like most of these medications, you should use this on a short-term basis only.   Enema: Enemas strike fear in many people, but FEAR NOT! It's nowhere near as big a deal as you may think. An enema is just a way to get some liquid into your rectum by placing a specially designed device through your anus. If you have never done one, it might seem like a painful, unpleasant, uncomfortable, complicated and lengthy procedure. But in reality, it's simple, takes just a few seconds and is highly effective. The small ready-made bottles you buy at the pharmacy are much easier than the hose/large rubber container type. Those recommended positions illustrated in some instructions are  generally not necessary to place the enema. It's very similar to the insertion of a tampon, requiring a slight squat. Some extra lubrication on the enema's tip (or on your anus) will make it a breeze. In certain cases, there is no substitute for a good enema. For example, if someone has not pooped for a few days, the beginning of the poop waiting to come out can become rock hard. Passing that hard stool can lead to much pain and problems like anal fissures. Inserting a little liquid to break up the rock-hard stool will help make its passage much easier. Enemas come  with different liquids. Most come with saline, but there are also mineral oil options. You can also use warm water in the reusable enema containers. They all work. But since saline can sometimes be irritating, so try a mineral oil or water enema instead.  Here are commonly recommended constipation medications that do not work well for post-surgery constipation: Docusate: Docusate (generic name) most commonly referred to as Colace (brand name) is not really a laxative, but is classified as a stool softener. Although this medication is commonly prescribed, it is not recommended for several reasons: 1) there is no good medical evidence that it works 2) even if it has an effect, which is very questionable, it is minimal and cannot combat the intestinal slowing caused by the opioid medications. Skip docusate to save money and space in your pillbox for something more effective.  PEG: Miralax (brand name) is basically a chemical called polyethylene glycol (PEG) and it has gained tremendous popularity as a laxative. This product is an "osmotic laxative" meaning it works by pulling water into the stool, making it softer. This is very similar to the action of natural fiber in foods and supplements. Therefore, the effect seen by this medication is not immediate, causing a bowel movement in a day or more. Is this medication strong enough to battle the  constipation related to having an operation? Maybe for some people not prone to constipation. But for most people, other laxatives are better to prevent constipation after surgery. AMBULATORY SURGERY  DISCHARGE INSTRUCTIONS   The drugs that you were given will stay in your system until tomorrow so for the next 24 hours you should not:  Drive an automobile Make any legal decisions Drink any alcoholic beverage   You may resume regular meals tomorrow.  Today it is better to start with liquids and gradually work up to solid foods.  You may eat anything you prefer, but it is better to start with liquids, then soup and crackers, and gradually work up to solid foods.   Please notify your doctor immediately if you have any unusual bleeding, trouble breathing, redness and pain at the surgery site, drainage, fever, or pain not relieved by medication.    Additional Instructions:        Please contact your physician with any problems or Same Day Surgery at [phone], Monday through Friday 6 am to 4 pm, or [place] [facility] number at [phone].

## 2021-05-02 ENCOUNTER — Encounter: Payer: Self-pay | Admitting: Obstetrics and Gynecology

## 2021-05-04 LAB — SURGICAL PATHOLOGY

## 2021-06-23 ENCOUNTER — Encounter: Payer: Self-pay | Admitting: Obstetrics and Gynecology

## 2022-09-16 ENCOUNTER — Other Ambulatory Visit: Payer: Self-pay | Admitting: Internal Medicine

## 2022-09-16 DIAGNOSIS — R109 Unspecified abdominal pain: Secondary | ICD-10-CM

## 2022-09-16 DIAGNOSIS — R140 Abdominal distension (gaseous): Secondary | ICD-10-CM

## 2022-09-21 ENCOUNTER — Ambulatory Visit
Admission: RE | Admit: 2022-09-21 | Discharge: 2022-09-21 | Disposition: A | Discharge status: Home / Self Care | Payer: Medicaid Other | Source: Ambulatory Visit | Attending: Internal Medicine | Admitting: Internal Medicine

## 2022-09-21 DIAGNOSIS — R140 Abdominal distension (gaseous): Secondary | ICD-10-CM | POA: Diagnosis present

## 2022-09-21 DIAGNOSIS — R109 Unspecified abdominal pain: Secondary | ICD-10-CM | POA: Diagnosis present

## 2022-09-28 ENCOUNTER — Ambulatory Visit: Payer: Self-pay | Admitting: Surgery

## 2022-09-28 NOTE — H&P (View-Only) (Signed)
Subjective:    CC: Biliary colic [K80.50]   HPI:  Sheryl Gross is a 41 y.o. female who was referred by Vishwanath Handattur Ha* for evaluation of above CC. Symptoms were first noted 6 month ago. Pain is episodic, lasts couple hours, with bloating.  Pain has now localized to RUQ, radiating to right shoulder.  Associated with constipation, which is chronic, before these episodes started.  exacerbated by nothing specific.  Also has some nausea after episode.       Past Medical History:  has a past medical history of Allergic rhinitis, Anxiety, Colitis, unspecified, Hives, and Kidney stones.   Past Surgical History:  has a past surgical history that includes Excision Ganglion Cyst Wrist Primary (Right, 07/28/2012); Cesarean section (03/19/2016); Laparoscopic Salpingectomy (Bilateral, 02/2016); and Hysterectomy.   Family History: family history includes Allergies in her father; Cervical cancer in her maternal grandmother; Diabetes type II in her mother; High blood pressure (Hypertension) in her mother; Other (age of onset: 20) in her sister; Ovarian cancer in her paternal grandmother.   Social History:  reports that she has never smoked. She has never used smokeless tobacco. She reports current alcohol use. She reports that she does not use drugs.   Current Medications: has a current medication list which includes the following prescription(s): acyclovir, bupropion, cetirizine, ibuprofen, omeprazole, phentermine, and metoprolol succinate.   Allergies:       Allergies as of 09/28/2022 - Reviewed 09/28/2022  Allergen Reaction Noted   Prevacid [lansoprazole] Hives 10/29/2013      ROS:  A 15 point review of systems was performed and pertinent positives and negatives noted in HPI    Objective:    BP 126/76   Pulse 95   Ht 160 cm (5' 3")   Wt 76.2 kg (168 lb)   LMP 01/29/2019   BMI 29.76 kg/m      Constitutional :  No distress, cooperative, alert  Lymphatics/Throat:  Supple with no  lymphadenopathy  Respiratory:  Clear to auscultation bilaterally  Cardiovascular:  Regular rate and rhythm  Gastrointestinal: Soft, slight RUQ TTP, non-distended, no organomegaly.  Musculoskeletal: Steady gait and movement  Skin: Cool and moist  Psychiatric: Normal affect, non-agitated, not confused         LABS:  N/a    RADS: CLINICAL DATA:  abdominal discomfort   EXAM:  ULTRASOUND ABDOMEN LIMITED RIGHT UPPER QUADRANT   COMPARISON:  August 01, 2012   FINDINGS:  Gallbladder:   Cholelithiasis. Adenomyomatosis. No wall thickening visualized. No  sonographic Murphy sign noted by sonographer.   Common bile duct:   Diameter: Visualized portion measures 5 mm, within normal limits.   Liver:   No focal lesion identified. Mildly increased hepatic parenchymal  echogenicity. Portal vein is patent on color Doppler imaging with  normal direction of blood flow towards the liver.   Other: None.   IMPRESSION:  1. Cholelithiasis without sonographic evidence of acute  cholecystitis.  2. Mildly increased hepatic parenchymal echogenicity. This is a  nonspecific finding but is most commonly seen in hepatic steatosis.    Electronically Signed    By: Stephanie  Peacock M.D.    On: 09/21/2022 11:56  Assessment:       Biliary colic [K80.50]   Plan:    1. Biliary colic [K80.50] Discussed the risk of surgery including post-op infxn, seroma, biloma, chronic pain, poor-delayed wound healing, retained gallstone, conversion to open procedure, post-op SBO or ileus, and need for additional procedures to address said risks.  The risks   of general anesthetic including MI, CVA, sudden death or even reaction to anesthetic medications also discussed. Alternatives include continued observation.  Benefits include possible symptom relief, prevention of complications including acute cholecystitis, pancreatitis.   Typical post operative recovery of 3-5 days rest, continued pain in area and incision  sites, possible loose stools up to 4-6 weeks, also discussed.   ED return precautions given for sudden increase in RUQ pain, with possible accompanying fever, nausea, and/or vomiting.   The patient understands the risks, any and all questions were answered to the patient's satisfaction.   2. Patient has elected to proceed with surgical treatment. Procedure will be scheduled.  Written consent was obtained..robotic assisted laparoscopic   labs/images/medications/previous chart entries reviewed personally and relevant changes/updates noted above   

## 2022-09-28 NOTE — H&P (Signed)
Subjective:    CC: Biliary colic [K80.50]   HPI:  Sheryl Gross is a 42 y.o. female who was referred by Margarito Liner* for evaluation of above CC. Symptoms were first noted 6 month ago. Pain is episodic, lasts couple hours, with bloating.  Pain has now localized to RUQ, radiating to right shoulder.  Associated with constipation, which is chronic, before these episodes started.  exacerbated by nothing specific.  Also has some nausea after episode.       Past Medical History:  has a past medical history of Allergic rhinitis, Anxiety, Colitis, unspecified, Hives, and Kidney stones.   Past Surgical History:  has a past surgical history that includes Excision Ganglion Cyst Wrist Primary (Right, 07/28/2012); Cesarean section (03/19/2016); Laparoscopic Salpingectomy (Bilateral, 02/2016); and Hysterectomy.   Family History: family history includes Allergies in her father; Cervical cancer in her maternal grandmother; Diabetes type II in her mother; High blood pressure (Hypertension) in her mother; Other (age of onset: 84) in her sister; Ovarian cancer in her paternal grandmother.   Social History:  reports that she has never smoked. She has never used smokeless tobacco. She reports current alcohol use. She reports that she does not use drugs.   Current Medications: has a current medication list which includes the following prescription(s): acyclovir, bupropion, cetirizine, ibuprofen, omeprazole, phentermine, and metoprolol succinate.   Allergies:       Allergies as of 09/28/2022 - Reviewed 09/28/2022  Allergen Reaction Noted   Prevacid [lansoprazole] Hives 10/29/2013      ROS:  A 15 point review of systems was performed and pertinent positives and negatives noted in HPI    Objective:    BP 126/76   Pulse 95   Ht 160 cm (5\' 3" )   Wt 76.2 kg (168 lb)   LMP 01/29/2019   BMI 29.76 kg/m      Constitutional :  No distress, cooperative, alert  Lymphatics/Throat:  Supple with no  lymphadenopathy  Respiratory:  Clear to auscultation bilaterally  Cardiovascular:  Regular rate and rhythm  Gastrointestinal: Soft, slight RUQ TTP, non-distended, no organomegaly.  Musculoskeletal: Steady gait and movement  Skin: Cool and moist  Psychiatric: Normal affect, non-agitated, not confused         LABS:  N/a    RADS: CLINICAL DATA:  abdominal discomfort   EXAM:  ULTRASOUND ABDOMEN LIMITED RIGHT UPPER QUADRANT   COMPARISON:  August 01, 2012   FINDINGS:  Gallbladder:   Cholelithiasis. Adenomyomatosis. No wall thickening visualized. No  sonographic Murphy sign noted by sonographer.   Common bile duct:   Diameter: Visualized portion measures 5 mm, within normal limits.   Liver:   No focal lesion identified. Mildly increased hepatic parenchymal  echogenicity. Portal vein is patent on color Doppler imaging with  normal direction of blood flow towards the liver.   Other: None.   IMPRESSION:  1. Cholelithiasis without sonographic evidence of acute  cholecystitis.  2. Mildly increased hepatic parenchymal echogenicity. This is a  nonspecific finding but is most commonly seen in hepatic steatosis.    Electronically Signed    By: Meda Klinefelter M.D.    On: 09/21/2022 11:56  Assessment:       Biliary colic [K80.50]   Plan:    1. Biliary colic [K80.50] Discussed the risk of surgery including post-op infxn, seroma, biloma, chronic pain, poor-delayed wound healing, retained gallstone, conversion to open procedure, post-op SBO or ileus, and need for additional procedures to address said risks.  The risks  of general anesthetic including MI, CVA, sudden death or even reaction to anesthetic medications also discussed. Alternatives include continued observation.  Benefits include possible symptom relief, prevention of complications including acute cholecystitis, pancreatitis.   Typical post operative recovery of 3-5 days rest, continued pain in area and incision  sites, possible loose stools up to 4-6 weeks, also discussed.   ED return precautions given for sudden increase in RUQ pain, with possible accompanying fever, nausea, and/or vomiting.   The patient understands the risks, any and all questions were answered to the patient's satisfaction.   2. Patient has elected to proceed with surgical treatment. Procedure will be scheduled.  Written consent was obtained..robotic assisted laparoscopic   labs/images/medications/previous chart entries reviewed personally and relevant changes/updates noted above

## 2022-10-08 ENCOUNTER — Encounter
Admission: RE | Admit: 2022-10-08 | Discharge: 2022-10-08 | Disposition: A | Discharge status: Home / Self Care | Payer: Medicaid Other | Source: Ambulatory Visit | Attending: Surgery | Admitting: Surgery

## 2022-10-08 HISTORY — DX: Calculus of bile duct without cholangitis or cholecystitis without obstruction: K80.50

## 2022-10-08 NOTE — Patient Instructions (Addendum)
Your procedure is scheduled on:10-14-22 Thursday Report to the Registration Desk on the 1st floor of the [facility].Then proceed to the 2nd floor Surgery Desk To find out your arrival time, please call [phone] between 1PM - 3PM on:10-13-22 Wednesday If your arrival time is 6:00 am, do not arrive before that time as the [facility] entrance doors do not open until 6:00 am.  REMEMBER: Instructions that are not followed completely may result in serious medical risk, up to and including death; or upon the discretion of your surgeon and anesthesiologist your surgery may need to be rescheduled.  Do not eat food after midnight the night before surgery.  No gum chewing or hard candies.  You may however, drink CLEAR liquids up to 2 hours before you are scheduled to arrive for your surgery. Do not drink anything within 2 hours of your scheduled arrival time.  Clear liquids include: - water  - apple juice without pulp - gatorade (not RED colors) - black coffee or tea (Do NOT add milk or creamers to the coffee or tea) Do NOT drink anything that is not on this list.  One week prior to surgery: (Last dose 10-08-22) Stop Anti-inflammatories (NSAIDS) such as Advil, Aleve, Ibuprofen, Motrin, Naproxen, Naprosyn and Aspirin based products such as Excedrin, Goody's Powder, BC Powder.You may however, take Tylenol if needed for pain up until the day of surgery.  Stop ANY OVER THE COUNTER supplements/vitamins NOW (10-08-22) until after surgery (Vitamin B12 and Biotin)  Stop your phentermine (ADIPEX-P) NOW (10-08-22) until after surgery  TAKE ONLY THESE MEDICATIONS THE MORNING OF SURGERY WITH A SIP OF WATER: -buPROPion (WELLBUTRIN XL)  -omeprazole (PRILOSEC OTC)-take one the night before and one on the morning of surgery - helps to prevent nausea after surgery.)  No Alcohol for 24 hours before or after surgery.  No Smoking including e-cigarettes for 24 hours before surgery.  No chewable tobacco  products for at least 6 hours before surgery.  No nicotine patches on the day of surgery.  Do not use any "recreational" drugs for at least a week (preferably 2 weeks) before your surgery.  Please be advised that the combination of cocaine and anesthesia may have negative outcomes, up to and including death. If you test positive for cocaine, your surgery will be cancelled.  On the morning of surgery brush your teeth with toothpaste and water, you may rinse your mouth with mouthwash if you wish. Do not swallow any toothpaste or mouthwash.  Use CHG Soap as directed on instruction sheet.  Do not wear jewelry, make-up, hairpins, clips or nail polish.  Do not wear lotions, powders, or perfumes.   Do not shave body hair from the neck down 48 hours before surgery.  Contact lenses, hearing aids and dentures may not be worn into surgery.  Do not bring valuables to the hospital. [facility] is not responsible for any missing/lost belongings or valuables.   Notify your doctor if there is any change in your medical condition (cold, fever, infection).  Wear comfortable clothing (specific to your surgery type) to the hospital.  After surgery, you can help prevent lung complications by doing breathing exercises.  Take deep breaths and cough every 1-2 hours. Your doctor may order a device called an Incentive Spirometer to help you take deep breaths. When coughing or sneezing, hold a pillow firmly against your incision with both hands. This is called "splinting." Doing this helps protect your incision. It also decreases belly discomfort.  If you  are being admitted to the hospital overnight, leave your suitcase in the car. After surgery it may be brought to your room.  In case of increased patient census, it may be necessary for you, the patient, to continue your postoperative care in the Same Day Surgery department.  If you are being discharged the day of surgery, you will not be allowed to drive  home. You will need a responsible individual to drive you home and stay with you for 24 hours after surgery.   If you are taking public transportation, you will need to have a responsible individual with you.  Please call the Pre-admissions Testing Dept. at [phone] if you have any questions about these instructions.  Surgery Visitation Policy:  Patients having surgery or a procedure may have two visitors.  Children under the age of 50 must have an adult with them who is not the patient.     Preparing for Surgery with CHLORHEXIDINE GLUCONATE (CHG) Soap  Chlorhexidine Gluconate (CHG) Soap  o An antiseptic cleaner that kills germs and bonds with the skin to continue killing germs even after washing  o Used for showering the night before surgery and morning of surgery  Before surgery, you can play an important role by reducing the number of germs on your skin.  CHG (Chlorhexidine gluconate) soap is an antiseptic cleanser which kills germs and bonds with the skin to continue killing germs even after washing.  Please do not use if you have an allergy to CHG or antibacterial soaps. If your skin becomes reddened/irritated stop using the CHG.  1. Shower the NIGHT BEFORE SURGERY and the MORNING OF SURGERY with CHG soap.  2. If you choose to wash your hair, wash your hair first as usual with your normal shampoo.  3. After shampooing, rinse your hair and body thoroughly to remove the shampoo.  4. Use CHG as you would any other liquid soap. You can apply CHG directly to the skin and wash gently with a scrungie or a clean washcloth.  5. Apply the CHG soap to your body only from the neck down. Do not use on open wounds or open sores. Avoid contact with your eyes, ears, mouth, and genitals (private parts). Wash face and genitals (private parts) with your normal soap.  6. Wash thoroughly, paying special attention to the area where your surgery will be performed.  7. Thoroughly rinse  your body with warm water.  8. Do not shower/wash with your normal soap after using and rinsing off the CHG soap.  9. Pat yourself dry with a clean towel.  10. Wear clean pajamas to bed the night before surgery.  12. Place clean sheets on your bed the night of your first shower and do not sleep with pets.  13. Shower again with the CHG soap on the day of surgery prior to arriving at the hospital.  14. Do not apply any deodorants/lotions/powders.  15. Please wear clean clothes to the hospital.

## 2022-10-13 MED ORDER — CHLORHEXIDINE GLUCONATE CLOTH 2 % EX PADS: 6.0000 | MEDICATED_PAD | Freq: Once | CUTANEOUS | Status: DC

## 2022-10-13 MED ORDER — ORAL CARE MOUTH RINSE: 15.0000 mL | Freq: Once | OROMUCOSAL | Status: AC

## 2022-10-13 MED ORDER — LACTATED RINGERS IV SOLN: INTRAVENOUS | Status: DC

## 2022-10-13 MED ORDER — CEFAZOLIN SODIUM-DEXTROSE 2-4 GM/100ML-% IV SOLN
2.0000 g | INTRAVENOUS | Status: AC
  Administered 2022-10-14: 2 g via INTRAVENOUS

## 2022-10-13 MED ORDER — CHLORHEXIDINE GLUCONATE 0.12 % MT SOLN
15.0000 mL | Freq: Once | OROMUCOSAL | Status: AC
  Administered 2022-10-14: 15 mL via OROMUCOSAL

## 2022-10-13 MED ORDER — INDOCYANINE GREEN 25 MG IV SOLR
1.2500 mg | Freq: Once | INTRAVENOUS | Status: AC
  Administered 2022-10-14: 1.25 mg via INTRAVENOUS
  Filled 2022-10-13: qty 0.5

## 2022-10-14 ENCOUNTER — Ambulatory Visit: Payer: Medicaid Other | Admitting: Anesthesiology

## 2022-10-14 ENCOUNTER — Other Ambulatory Visit: Payer: Self-pay

## 2022-10-14 ENCOUNTER — Encounter: Payer: Self-pay | Admitting: Surgery

## 2022-10-14 ENCOUNTER — Ambulatory Visit
Admission: RE | Admit: 2022-10-14 | Discharge: 2022-10-14 | Disposition: A | Discharge status: Home / Self Care | Payer: Medicaid Other | Attending: Surgery | Admitting: Surgery

## 2022-10-14 ENCOUNTER — Encounter
Admission: RE | Disposition: A | Discharge status: Home / Self Care | Payer: Self-pay | Source: Home / Self Care | Attending: Surgery

## 2022-10-14 DIAGNOSIS — K8060 Calculus of gallbladder and bile duct with cholecystitis, unspecified, without obstruction: Secondary | ICD-10-CM | POA: Insufficient documentation

## 2022-10-14 DIAGNOSIS — K8050 Calculus of bile duct without cholangitis or cholecystitis without obstruction: Secondary | ICD-10-CM | POA: Diagnosis present

## 2022-10-14 DIAGNOSIS — F32A Depression, unspecified: Secondary | ICD-10-CM | POA: Diagnosis not present

## 2022-10-14 SURGERY — CHOLECYSTECTOMY, ROBOT-ASSISTED, LAPAROSCOPIC
Anesthesia: General | Site: Abdomen

## 2022-10-14 MED ORDER — BUPIVACAINE HCL (PF) 0.5 % IJ SOLN
INTRAMUSCULAR | Status: AC
  Filled 2022-10-14: qty 30

## 2022-10-14 MED ORDER — ACETAMINOPHEN 10 MG/ML IV SOLN
INTRAVENOUS | Status: DC | PRN
  Administered 2022-10-14: 1000 mg via INTRAVENOUS

## 2022-10-14 MED ORDER — KETOROLAC TROMETHAMINE 30 MG/ML IJ SOLN
INTRAMUSCULAR | Status: AC
  Filled 2022-10-14: qty 1

## 2022-10-14 MED ORDER — DEXAMETHASONE SODIUM PHOSPHATE 10 MG/ML IJ SOLN
INTRAMUSCULAR | Status: DC | PRN
  Administered 2022-10-14: 10 mg via INTRAVENOUS

## 2022-10-14 MED ORDER — PROPOFOL 10 MG/ML IV BOLUS
INTRAVENOUS | Status: DC | PRN
  Administered 2022-10-14: 200 mg via INTRAVENOUS

## 2022-10-14 MED ORDER — OXYCODONE HCL 5 MG PO TABS
ORAL_TABLET | ORAL | Status: AC
  Filled 2022-10-14: qty 1

## 2022-10-14 MED ORDER — FENTANYL CITRATE (PF) 100 MCG/2ML IJ SOLN
INTRAMUSCULAR | Status: AC
  Filled 2022-10-14: qty 2

## 2022-10-14 MED ORDER — FENTANYL CITRATE (PF) 100 MCG/2ML IJ SOLN
25.0000 ug | INTRAMUSCULAR | Status: DC | PRN
  Administered 2022-10-14 (×2): 25 ug via INTRAVENOUS

## 2022-10-14 MED ORDER — DOCUSATE SODIUM 100 MG PO CAPS: 100.0000 mg | ORAL_CAPSULE | Freq: Two times a day (BID) | ORAL | 0 refills | Status: AC | PRN

## 2022-10-14 MED ORDER — LIDOCAINE-EPINEPHRINE (PF) 1 %-1:200000 IJ SOLN
INTRAMUSCULAR | Status: AC
  Filled 2022-10-14: qty 30

## 2022-10-14 MED ORDER — CEFAZOLIN SODIUM-DEXTROSE 2-4 GM/100ML-% IV SOLN
INTRAVENOUS | Status: AC
  Filled 2022-10-14: qty 100

## 2022-10-14 MED ORDER — PROPOFOL 10 MG/ML IV BOLUS
INTRAVENOUS | Status: AC
  Filled 2022-10-14: qty 20

## 2022-10-14 MED ORDER — DEXAMETHASONE SODIUM PHOSPHATE 10 MG/ML IJ SOLN
INTRAMUSCULAR | Status: AC
  Filled 2022-10-14: qty 1

## 2022-10-14 MED ORDER — LIDOCAINE HCL (PF) 2 % IJ SOLN
INTRAMUSCULAR | Status: AC
  Filled 2022-10-14: qty 5

## 2022-10-14 MED ORDER — ACETAMINOPHEN 10 MG/ML IV SOLN: 1000.0000 mg | Freq: Once | INTRAVENOUS | Status: DC | PRN

## 2022-10-14 MED ORDER — MIDAZOLAM HCL 2 MG/2ML IJ SOLN
INTRAMUSCULAR | Status: AC
  Filled 2022-10-14: qty 2

## 2022-10-14 MED ORDER — SUGAMMADEX SODIUM 200 MG/2ML IV SOLN
INTRAVENOUS | Status: DC | PRN
  Administered 2022-10-14: 200 mg via INTRAVENOUS

## 2022-10-14 MED ORDER — MIDAZOLAM HCL 2 MG/2ML IJ SOLN
INTRAMUSCULAR | Status: DC | PRN
  Administered 2022-10-14: 2 mg via INTRAVENOUS

## 2022-10-14 MED ORDER — ROCURONIUM BROMIDE 10 MG/ML (PF) SYRINGE
PREFILLED_SYRINGE | INTRAVENOUS | Status: AC
  Filled 2022-10-14: qty 10

## 2022-10-14 MED ORDER — ROCURONIUM BROMIDE 100 MG/10ML IV SOLN
INTRAVENOUS | Status: DC | PRN
  Administered 2022-10-14: 20 mg via INTRAVENOUS
  Administered 2022-10-14: 50 mg via INTRAVENOUS

## 2022-10-14 MED ORDER — OXYCODONE HCL 5 MG PO TABS
5.0000 mg | ORAL_TABLET | Freq: Once | ORAL | Status: AC | PRN
  Administered 2022-10-14: 5 mg via ORAL

## 2022-10-14 MED ORDER — KETOROLAC TROMETHAMINE 30 MG/ML IJ SOLN
INTRAMUSCULAR | Status: DC | PRN
  Administered 2022-10-14: 30 mg via INTRAVENOUS

## 2022-10-14 MED ORDER — LIDOCAINE HCL (CARDIAC) PF 100 MG/5ML IV SOSY
PREFILLED_SYRINGE | INTRAVENOUS | Status: DC | PRN
  Administered 2022-10-14: 60 mg via INTRAVENOUS

## 2022-10-14 MED ORDER — ONDANSETRON HCL 4 MG/2ML IJ SOLN: 4.0000 mg | Freq: Once | INTRAMUSCULAR | Status: DC | PRN

## 2022-10-14 MED ORDER — ACETAMINOPHEN 325 MG PO TABS: 650.0000 mg | ORAL_TABLET | Freq: Three times a day (TID) | ORAL | 0 refills | Status: AC | PRN

## 2022-10-14 MED ORDER — ONDANSETRON HCL 4 MG/2ML IJ SOLN
INTRAMUSCULAR | Status: AC
  Filled 2022-10-14: qty 2

## 2022-10-14 MED ORDER — OXYCODONE HCL 5 MG/5ML PO SOLN: 5.0000 mg | Freq: Once | ORAL | Status: AC | PRN

## 2022-10-14 MED ORDER — ONDANSETRON HCL 4 MG/2ML IJ SOLN
INTRAMUSCULAR | Status: DC | PRN
  Administered 2022-10-14: 4 mg via INTRAVENOUS

## 2022-10-14 MED ORDER — OXYCODONE-ACETAMINOPHEN 5-325 MG PO TABS: 1.0000 | ORAL_TABLET | Freq: Three times a day (TID) | ORAL | 0 refills | Status: AC | PRN

## 2022-10-14 MED ORDER — FENTANYL CITRATE (PF) 100 MCG/2ML IJ SOLN
INTRAMUSCULAR | Status: DC | PRN
  Administered 2022-10-14 (×2): 50 ug via INTRAVENOUS

## 2022-10-14 MED ORDER — CHLORHEXIDINE GLUCONATE 0.12 % MT SOLN
OROMUCOSAL | Status: AC
  Filled 2022-10-14: qty 15

## 2022-10-14 MED ORDER — ACETAMINOPHEN 10 MG/ML IV SOLN
INTRAVENOUS | Status: AC
  Filled 2022-10-14: qty 100

## 2022-10-14 MED ORDER — LIDOCAINE-EPINEPHRINE (PF) 1 %-1:200000 IJ SOLN
INTRAMUSCULAR | Status: DC | PRN
  Administered 2022-10-14: 45 mL via INTRAMUSCULAR

## 2022-10-14 SURGICAL SUPPLY — 53 items
ADH SKN CLS APL DERMABOND .7 (GAUZE/BANDAGES/DRESSINGS) ×2
ANCHOR TIS RET SYS 235ML (MISCELLANEOUS) ×2 IMPLANT
BAG PRESSURE INF REUSE 1000 (BAG) IMPLANT
BAG TISS RTRVL C235 10X14 (MISCELLANEOUS)
BLADE SURG SZ11 CARB STEEL (BLADE) ×2 IMPLANT
CANNULA REDUCER 12-8 DVNC XI (CANNULA) ×2 IMPLANT
CATH REDDICK CHOLANGI 4FR 50CM (CATHETERS) IMPLANT
CAUTERY HOOK MNPLR 1.6 DVNC XI (INSTRUMENTS) ×2 IMPLANT
CLIP LIGATING HEMO O LOK GREEN (MISCELLANEOUS) ×2 IMPLANT
DERMABOND ADVANCED .7 DNX12 (GAUZE/BANDAGES/DRESSINGS) ×2 IMPLANT
DRAPE ARM DVNC X/XI (DISPOSABLE) ×8 IMPLANT
DRAPE C-ARM XRAY 36X54 (DRAPES) IMPLANT
DRAPE COLUMN DVNC XI (DISPOSABLE) ×2 IMPLANT
ELECT CAUTERY BLADE 6.4 (BLADE) ×2 IMPLANT
ELECT REM PT RETURN 9FT ADLT (ELECTROSURGICAL) ×2
ELECTRODE REM PT RTRN 9FT ADLT (ELECTROSURGICAL) ×2 IMPLANT
FORCEPS BPLR 8 MD DVNC XI (FORCEP) ×2 IMPLANT
FORCEPS BPLR FENES DVNC XI (FORCEP) ×2 IMPLANT
FORCEPS PROGRASP DVNC XI (FORCEP) ×2 IMPLANT
GLOVE BIOGEL PI IND STRL 7.0 (GLOVE) ×4 IMPLANT
GLOVE SURG SYN 6.5 ES PF (GLOVE) ×12 IMPLANT
GLOVE SURG SYN 6.5 PF PI (GLOVE) ×4 IMPLANT
GOWN STRL REUS W/ TWL LRG LVL3 (GOWN DISPOSABLE) ×6 IMPLANT
GOWN STRL REUS W/TWL LRG LVL3 (GOWN DISPOSABLE) ×12
GRASPER SUT TROCAR 14GX15 (MISCELLANEOUS) IMPLANT
IRRIGATOR SUCT 8 DISP DVNC XI (IRRIGATION / IRRIGATOR) IMPLANT
IV NS 1000ML (IV SOLUTION)
IV NS 1000ML BAXH (IV SOLUTION) IMPLANT
KIT TURNOVER KIT A (KITS) ×2 IMPLANT
LABEL OR SOLS (LABEL) ×2 IMPLANT
MANIFOLD NEPTUNE II (INSTRUMENTS) ×2 IMPLANT
NDL HYPO 22X1.5 SAFETY MO (MISCELLANEOUS) ×2 IMPLANT
NDL INSUFFLATION 14GA 120MM (NEEDLE) ×2 IMPLANT
NEEDLE HYPO 22X1.5 SAFETY MO (MISCELLANEOUS) ×2 IMPLANT
NEEDLE INSUFFLATION 14GA 120MM (NEEDLE) ×2 IMPLANT
NS IRRIG 500ML POUR BTL (IV SOLUTION) ×2 IMPLANT
OBTURATOR OPTICAL STND 8 DVNC (TROCAR) ×2
OBTURATOR OPTICALSTD 8 DVNC (TROCAR) ×2 IMPLANT
PACK LAP CHOLECYSTECTOMY (MISCELLANEOUS) ×2 IMPLANT
PENCIL SMOKE EVACUATOR (MISCELLANEOUS) ×2 IMPLANT
SEAL UNIV 5-12 XI (MISCELLANEOUS) ×8 IMPLANT
SET TUBE SMOKE EVAC HIGH FLOW (TUBING) ×2 IMPLANT
SOL ELECTROSURG ANTI STICK (MISCELLANEOUS) ×2
SOLUTION ELECTROSURG ANTI STCK (MISCELLANEOUS) ×2 IMPLANT
SPIKE FLUID TRANSFER (MISCELLANEOUS) ×4 IMPLANT
SUT MNCRL 4-0 (SUTURE) ×4
SUT MNCRL 4-0 27XMFL (SUTURE) ×4
SUT VICRYL 0 UR6 27IN ABS (SUTURE) ×2 IMPLANT
SUTURE MNCRL 4-0 27XMF (SUTURE) ×4 IMPLANT
SYR 30ML LL (SYRINGE) IMPLANT
SYSTEM WECK SHIELD CLOSURE (TROCAR) IMPLANT
TRAP FLUID SMOKE EVACUATOR (MISCELLANEOUS) ×2 IMPLANT
WATER STERILE IRR 500ML POUR (IV SOLUTION) ×2 IMPLANT

## 2022-10-14 NOTE — Op Note (Signed)
Preoperative diagnosis:  biliary colic  Postoperative diagnosis: same as above  Procedure: Robotic assisted Laparoscopic Cholecystectomy.   Anesthesia: GETA   Surgeon: Sung Amabile  Specimen: Gallbladder  Complications: None  EBL: 15mL  Wound Classification: Clean Contaminated  Indications: see HPI  Findings: Critical view of safety noted Cystic duct and artery identified, ligated and divided, clips remained intact at end of procedure Adequate hemostasis  Description of procedure:  The patient was placed on the operating table in the supine position. SCDs placed, pre-op abx administered.  General anesthesia was induced and OG tube placed by anesthesia. A time-out was completed verifying correct patient, procedure, site, positioning, and implant(s) and/or special equipment prior to beginning this procedure. The abdomen was prepped and draped in the usual sterile fashion.    Veress needle was placed at the Palmer's point and insufflation was started after confirming a positive saline drop test and no immediate increase in abdominal pressure.  After reaching 15 mm, the Veress needle was removed and 5mm port placed at same location via optiview technique. The abdomen was inspected and no abnormalities or injuries were found. Under direct vision, ports were placed in the following locations: a 8 mm port was placed via under umbilicus measured 20mm from gallbladder.  One 12 mm patient left of the umbilicus, 8cm away, one 8 mm port placed to the patient right of the umbilical port 8 cm apart.  1 additional 8 mm port placed lateral to the 12mm port.  Once ports were placed, The table was placed in the reverse Trendelenburg position with the right side up. The Xi platform was brought into the operative field and docked to the ports successfully.  An endoscope was placed through the umbilical port, fenestrated grasper through the adjacent patient right port, prograsp to the far patient left port,  and then a hook cautery in the left port.  The dome of the gallbladder was grasped with prograsp, passed and retracted over the dome of the liver. Adhesions between the gallbladder and omentum, duodenum and transverse colon were lysed via hook cautery. The infundibulum was grasped with the fenestrated grasper and retracted toward the right lower quadrant. This maneuver exposed Calot's triangle. The peritoneum overlying the gallbladder infundibulum was then dissected  and the cystic duct and cystic artery identified.  Critical view of safety with the liver bed clearly visible behind the duct and artery with no additional structures noted.  The cystic duct and cystic artery clipped and divided close to the gallbladder.     The gallbladder was then dissected from its peritoneal and liver bed attachments by electrocautery. Hemostasis was checked prior to removing the hook cautery and the Endo Catch bag was then placed through the 12 mm port and the gallbladder was removed.  The gallbladder was passed off the table as a specimen. There was no evidence of bleeding from the gallbladder fossa or cystic artery or leakage of the bile from the cystic duct stump. The 12 mm port site closed with PMI using 0 vicryl under direct vision.  Abdomen desufflated and secondary trocars were removed under direct vision. No bleeding was noted. All skin incisions then closed with subcuticular sutures of 4-0 monocryl and dressed with topical skin adhesive. The orogastric tube was removed and patient extubated.  The patient tolerated the procedure well and was taken to the postanesthesia care unit in stable condition.  All sponge and instrument count correct at end of procedure.

## 2022-10-14 NOTE — Anesthesia Procedure Notes (Signed)
Procedure Name: Intubation Date/Time: 10/14/2022 7:40 AM  Performed by: Jeannene Patella, CRNAPre-anesthesia Checklist: Patient identified, Emergency Drugs available, Suction available, Patient being monitored and Timeout performed Patient Re-evaluated:Patient Re-evaluated prior to induction Oxygen Delivery Method: Circle system utilized Preoxygenation: Pre-oxygenation with 100% oxygen Induction Type: IV induction Ventilation: Mask ventilation without difficulty Laryngoscope Size: McGraph and 4 Grade View: Grade II Tube type: Oral Tube size: 6.5 mm Number of attempts: 1 Airway Equipment and Method: Stylet Placement Confirmation: ETT inserted through vocal cords under direct vision, positive ETCO2 and breath sounds checked- equal and bilateral Secured at: 20 cm Tube secured with: Tape Dental Injury: Teeth and Oropharynx as per pre-operative assessment

## 2022-10-14 NOTE — Anesthesia Preprocedure Evaluation (Signed)
Anesthesia Evaluation  Patient identified by MRN, date of birth, ID band Patient awake    Reviewed: Allergy & Precautions, NPO status , Patient's Chart, lab work & pertinent test results  History of Anesthesia Complications Negative for: history of anesthetic complications  Airway Mallampati: II  TM Distance: >3 FB Neck ROM: Full    Dental no notable dental hx. (+) Teeth Intact   Pulmonary neg sleep apnea, neg COPD, Patient did not abstain from smoking., former smoker   Pulmonary exam normal breath sounds clear to auscultation- rhonchi (-) wheezing      Cardiovascular Exercise Tolerance: Good (-) hypertension(-) CAD, (-) Past MI, (-) Cardiac Stents and (-) CABG  Rhythm:Regular Rate:Normal - Systolic murmurs and - Diastolic murmurs    Neuro/Psych  Headaches, neg Seizures PSYCHIATRIC DISORDERS Anxiety Depression       GI/Hepatic negative GI ROS, Neg liver ROS,,,  Endo/Other  negative endocrine ROSneg diabetes    Renal/GU negative Renal ROS     Musculoskeletal negative musculoskeletal ROS (+)    Abdominal  (+) + obese  Peds  Hematology  (+) Blood dyscrasia, anemia   Anesthesia Other Findings Past Medical History: No date: Anemia No date: Anxiety No date: Depression No date: History of kidney stones No date: Spinal headache   Reproductive/Obstetrics                              Anesthesia Physical Anesthesia Plan  ASA: 2  Anesthesia Plan: General   Post-op Pain Management: Ofirmev IV (intra-op)* and Toradol IV (intra-op)*   Induction: Intravenous  PONV Risk Score and Plan: 4 or greater and Dexamethasone, Ondansetron and Midazolam  Airway Management Planned: Oral ETT  Additional Equipment: None  Intra-op Plan:   Post-operative Plan: Extubation in OR  Informed Consent: I have reviewed the patients History and Physical, chart, labs and discussed the procedure including the  risks, benefits and alternatives for the proposed anesthesia with the patient or authorized representative who has indicated his/her understanding and acceptance.     Dental advisory given  Plan Discussed with: CRNA and Anesthesiologist  Anesthesia Plan Comments: (Discussed risks of anesthesia with patient, including PONV, sore throat, lip/dental/eye damage. Rare risks discussed as well, such as cardiorespiratory and neurological sequelae, and allergic reactions. Discussed the role of CRNA in patient's perioperative care. Patient understands.)         Anesthesia Quick Evaluation

## 2022-10-14 NOTE — Anesthesia Postprocedure Evaluation (Signed)
Anesthesia Post Note  Patient: Sheryl Gross  Procedure(s) Performed: XI ROBOTIC ASSISTED LAPAROSCOPIC CHOLECYSTECTOMY (Abdomen) INDOCYANINE GREEN FLUORESCENCE IMAGING (ICG)  Patient location during evaluation: PACU Anesthesia Type: General Level of consciousness: awake and alert Pain management: pain level controlled Vital Signs Assessment: post-procedure vital signs reviewed and stable Respiratory status: spontaneous breathing, nonlabored ventilation, respiratory function stable and patient connected to nasal cannula oxygen Cardiovascular status: blood pressure returned to baseline and stable Postop Assessment: no apparent nausea or vomiting Anesthetic complications: no   No notable events documented.   Last Vitals:  Vitals:   10/14/22 0930 10/14/22 0947  BP: 116/79 122/81  Pulse: 90 73  Resp: 15 15  Temp: 36.7 C (!) 36.1 C  SpO2: 99% 99%    Last Pain:  Vitals:   10/14/22 0947  TempSrc: Temporal  PainSc: 1                  Corinda Gubler

## 2022-10-14 NOTE — Discharge Instructions (Addendum)
Laparoscopic Cholecystectomy, Care After This sheet gives you information about how to care for yourself after your procedure. Your doctor may also give you more specific instructions. If you have problems or questions, contact your doctor. Follow these instructions at home: Care for cuts from surgery (incisions)  Follow instructions from your doctor about how to take care of your cuts from surgery. Make sure you: Wash your hands with soap and water before you change your bandage (dressing). If you cannot use soap and water, use hand sanitizer. Change your bandage as told by your doctor. Leave stitches (sutures), skin glue, or skin tape (adhesive) strips in place. They may need to stay in place for 2 weeks or longer. If tape strips get loose and curl up, you may trim the loose edges. Do not remove tape strips completely unless your doctor says it is okay. Do not take baths, swim, or use a hot tub until your doctor says it is okay. OK TO SHOWER 24HRS AFTER YOUR SURGERY.  Check your surgical cut area every day for signs of infection. Check for: More redness, swelling, or pain. More fluid or blood. Warmth. Pus or a bad smell. Activity Do not drive or use heavy machinery while taking prescription pain medicine. Do not play contact sports until your doctor says it is okay. Do not drive for 24 hours if you were given a medicine to help you relax (sedative). Rest as needed. Do not return to work or school until your doctor says it is okay. General instructions  tylenol and advil as needed for discomfort.  Please alternate between the two every four hours as needed for pain.    Use narcotics, if prescribed, only when tylenol and motrin is not enough to control pain.  325-650mg every 8hrs to max of 3000mg/24hrs (including the 325mg in every norco dose) for the tylenol.    Advil up to 800mg per dose every 8hrs as needed for pain.   To prevent or treat constipation while you are taking prescription  pain medicine, your doctor may recommend that you: Drink enough fluid to keep your pee (urine) clear or pale yellow. Take over-the-counter or prescription medicines. Eat foods that are high in fiber, such as fresh fruits and vegetables, whole grains, and beans. Limit foods that are high in fat and processed sugars, such as fried and sweet foods. Contact a doctor if: You develop a rash. You have more redness, swelling, or pain around your surgical cuts. You have more fluid or blood coming from your surgical cuts. Your surgical cuts feel warm to the touch. You have pus or a bad smell coming from your surgical cuts. You have a fever. One or more of your surgical cuts breaks open. You have trouble breathing. You have chest pain. You have pain that is getting worse in your shoulders. You faint or feel dizzy when you stand. You have very bad pain in your belly (abdomen). You are sick to your stomach (nauseous) for more than one day. You have throwing up (vomiting) that lasts for more than one day. You have leg pain. This information is not intended to replace advice given to you by your health care provider. Make sure you discuss any questions you have with your health care provider. Document Released: 03/16/2008 Document Revised: 12/27/2015 Document Reviewed: 11/24/2015 Elsevier Interactive Patient Education  2019 [employer]     AMBULATORY SURGERY  DISCHARGE INSTRUCTIONS  The drugs that you were given will stay in your system until tomorrow   so for the next 24 hours you should not:  Drive an automobile Make any legal decisions Drink any alcoholic beverage  You may resume regular meals tomorrow.  Today it is better to start with liquids and gradually work up to solid foods.  You may eat anything you prefer, but it is better to start with liquids, then soup and crackers, and gradually work up to solid foods.  Please notify your doctor immediately if you have any unusual bleeding,  trouble breathing, redness and pain at the surgery site, drainage, fever, or pain not relieved by medication.  Additional Instructions:  Please contact your physician with any problems or Same Day Surgery at [phone], Monday through Friday 6 am to 4 pm, or [place] [facility] number at [phone]. 

## 2022-10-14 NOTE — Transfer of Care (Signed)
Immediate Anesthesia Transfer of Care Note  Patient: Sheryl Gross  Procedure(s) Performed: XI ROBOTIC ASSISTED LAPAROSCOPIC CHOLECYSTECTOMY (Abdomen) INDOCYANINE GREEN FLUORESCENCE IMAGING (ICG)  Patient Location: PACU  Anesthesia Type:General  Level of Consciousness: awake, oriented, and patient cooperative  Airway & Oxygen Therapy: Patient Spontanous Breathing and Patient connected to face mask oxygen  Post-op Assessment: Report given to RN and Post -op Vital signs reviewed and stable  Post vital signs: Reviewed and stable  Last Vitals:  Vitals Value Taken Time  BP 120/72 10/14/22 0853  Temp    Pulse 99 10/14/22 0856  Resp 20 10/14/22 0856  SpO2 100 % 10/14/22 0856  Vitals shown include unvalidated device data.  Last Pain:  Vitals:   10/14/22 0611  PainSc: 0-No pain         Complications: No notable events documented.

## 2022-10-14 NOTE — Interval H&P Note (Signed)
No change. OK to proceed.

## 2022-10-15 LAB — SURGICAL PATHOLOGY

## 2023-09-14 ENCOUNTER — Other Ambulatory Visit: Payer: Self-pay | Admitting: Internal Medicine

## 2023-09-14 DIAGNOSIS — Z1231 Encounter for screening mammogram for malignant neoplasm of breast: Secondary | ICD-10-CM

## 2023-09-20 ENCOUNTER — Encounter (INDEPENDENT_AMBULATORY_CARE_PROVIDER_SITE_OTHER): Payer: Self-pay | Admitting: Vascular Surgery

## 2023-09-20 ENCOUNTER — Ambulatory Visit (INDEPENDENT_AMBULATORY_CARE_PROVIDER_SITE_OTHER): Admitting: Vascular Surgery

## 2023-09-20 VITALS — BP 121/85 | HR 87 | Resp 18 | Ht 63.0 in | Wt 165.0 lb

## 2023-09-20 DIAGNOSIS — I7300 Raynaud's syndrome without gangrene: Secondary | ICD-10-CM | POA: Diagnosis not present

## 2023-09-20 NOTE — Assessment & Plan Note (Signed)
 The patient has profound symptoms of Raynaud's disease which are very bothersome to her.  At this point, we are going to do arterial duplex of the upper and lower extremities to ensure that there is not underlying significant vascular disease.  She does have a tobacco history.  Following the studies, we can discuss whether or not to add a calcium channel blocker.  We discussed the importance of cold avoidance and using gloves and socks to keep her hands and feet warm.  We discussed that this can be uncomfortable but without tissue loss this is not limb threatening at this point.  We discussed the pathophysiology and natural history of Raynaud's disease with the patient who is very familiar with.  She will follow-up after her noninvasive studies.

## 2023-09-20 NOTE — Patient Instructions (Signed)
 Raynaud's Phenomenon  Raynaud's phenomenon is a condition that affects the blood vessels (arteries) that carry blood to the fingers and toes. The arteries that supply blood to the ears, lips, nipples, or the tip of the nose might also be affected. Raynaud's phenomenon causes the arteries to become narrow temporarily (spasm). As a result, the flow of blood to the affected areas is temporarily decreased. This usually occurs in response to cold temperatures or stress. During an attack, the skin in the affected areas turns white, then blue, and finally red. A person may also feel tingling or numbness in those areas. Attacks usually last for only a brief period, and then the blood flow to the area returns to normal. In most cases, Raynaud's phenomenon does not cause serious health problems. What are the causes? In many cases, the cause of this condition is not known. The condition may occur on its own (primary Raynaud's phenomenon) or may be associated with other diseases or factors (secondary Raynaud's phenomenon). Possible causes may include: Diseases or medical conditions that damage the arteries. Injuries and repetitive actions that hurt the hands or feet. Being exposed to certain chemicals. Taking medicines that narrow the arteries. Other medical conditions, such as lupus, scleroderma, rheumatoid arthritis, thyroid problems, blood disorders, Sjogren syndrome, or atherosclerosis. What increases the risk? The following factors may make you more likely to develop this condition: Being 18-78 years old. Being female. Having a family history of Raynaud's phenomenon. Living in a cold climate. Smoking. What are the signs or symptoms? Symptoms of this condition usually occur when you are exposed to cold temperatures or when you have emotional stress. The symptoms may last for a few minutes or up to several hours. They usually affect your fingers but may also affect your toes, nipples, lips, ears, or the  tip of your nose. Symptoms may include: Changes in skin color. The skin in the affected areas will turn pale or white. The skin may then change from white to bluish to red as normal blood flow returns to the area. Numbness, tingling, or pain in the affected areas. In severe cases, symptoms may include: Skin sores. Tissues decaying and dying (gangrene). How is this diagnosed? This condition may be diagnosed based on: Your symptoms and medical history. A physical exam. During the exam, you may be asked to put your hands in cold water to check for a reaction to cold temperature. Tests, such as: Blood tests to check for other diseases or conditions. A test to check the movement of blood through your arteries and veins (vascular ultrasound). A test in which the skin at the base of your fingernail is examined under a microscope (nailfold capillaroscopy). How is this treated? During an episode, you can take actions to help symptoms go away faster. Options include moving your arms around in a windmill pattern, warming your fingers under warm water, or placing your fingers in a warm body fold, such as your armpit. Long-term treatment for this condition often involves making lifestyle changes and taking steps to control your exposure to cold temperature. For more severe cases, medicine (calcium channel blockers) may be used to improve blood circulation. Follow these instructions at home: Avoiding cold temperatures Take these steps to avoid exposure to cold: If possible, stay indoors during cold weather. When you go outside during cold weather, dress in layers and wear mittens, a hat, a scarf, and warm footwear. Wear mittens or gloves when handling ice or frozen food. Use holders for glasses or cans containing  cold drinks. Let warm water run for a while before taking a shower or bath. Warm up the car before driving in cold weather. Lifestyle If possible, avoid stressful and emotional situations. Try  to find ways to manage your stress, such as: Exercise. Yoga. Meditation. Biofeedback. Do not use any products that contain nicotine or tobacco. These products include cigarettes, chewing tobacco, and vaping devices, such as e-cigarettes. If you need help quitting, ask your health care provider. Avoid secondhand smoke. Limit your use of caffeine. Switch to decaffeinated coffee, tea, and soda. Avoid chocolate. Avoid vibrating tools and machinery. General instructions Protect your hands and feet from injuries, cuts, or bruises. Avoid wearing tight rings or wristbands. Wear loose fitting socks and comfortable, roomy shoes. Take over-the-counter and prescription medicines only as told by your health care provider. Where to find support Raynaud's Association: www.raynauds.org Where to find more information [employer] of Arthritis and Musculoskeletal and Skin Diseases: www.niams.http://www.myers.net/ Contact a health care provider if: Your discomfort becomes worse despite lifestyle changes. You develop sores on your fingers or toes that do not heal. You have breaks in the skin on your fingers or toes. You have a fever. You have pain or swelling in your joints. You have a rash. Your symptoms occur on only one side of your body. Get help right away if: Your fingers or toes turn black. You have severe pain in the affected areas. These symptoms may represent a serious problem that is an emergency. Do not wait to see if the symptoms will go away. Get medical help right away. Call your local emergency services (911 in the [place] Do not drive yourself to the hospital. Summary Raynaud's phenomenon is a condition that affects the arteries that carry blood to the fingers, toes, ears, lips, nipples, or the tip of the nose. In many cases, the cause of this condition is not known. Symptoms of this condition include changes in skin color along with numbness and tingling in the affected area. Treatment for  this condition includes lifestyle changes and reducing exposure to cold temperatures. Medicines may be used for severe cases of the condition. Contact your health care provider if your condition worsens despite treatment. This information is not intended to replace advice given to you by your health care provider. Make sure you discuss any questions you have with your health care provider. Document Revised: 08/12/2020 Document Reviewed: 08/12/2020 Elsevier Patient Education  2024 [employer].

## 2023-09-20 NOTE — Progress Notes (Signed)
 Patient ID: Sheryl Gross, female   DOB: December 08, 1980, 43 y.o.   MRN: [id number]  Chief Complaint  Patient presents with   New Patient (Initial Visit)    np. consult with MD ONLY. Raynaud's syndrome without gangrene. hande.    HPI Sheryl Gross is a 43 y.o. female.  I am asked to see the patient by DR. Hande for evaluation of Raynauds disease.  Patient has noticed symptoms of Raynaud's disease for over a decade now.  She notices marked discoloration and pain in both her hands and her feet.  This is particularly prominent with cold stimulation, but it can happen without provocation as well.  Seems to be getting worse over time.  She had a minor trauma to the toe about 2 years ago, and had very slow healing and and nail loss secondary to what was a fairly minor trauma.  She notices the symptoms the most in the spring and the fall when the weather changes are abrupt.  Nothing really makes it better.  Her primary care physician as well as her podiatrist were concerned that she could have underlying vascular disease particularly with the slow healing foot issue previously.  She does have a previous history of tobacco use but no longer smokes.     Past Medical History:  Diagnosis Date   Anemia    Anxiety    Biliary colic    Depression    History of kidney stones    Spinal headache     Past Surgical History:  Procedure Laterality Date   CESAREAN SECTION WITH BILATERAL TUBAL LIGATION Bilateral 03/19/2016   Procedure: CESAREAN SECTION WITH BILATERAL TUBAL LIGATION;  Surgeon: Elenora Fender Ward, MD;  Location: [facility];  Service: Obstetrics;  Laterality: Bilateral;  has epidural, anesthesia choice   GANGLION CYST EXCISION Right 2014   right wrist   LAPAROSCOPIC SUPRACERVICAL HYSTERECTOMY N/A 03/07/2019   Procedure: LAPAROSCOPIC SUPRACERVICAL HYSTERECTOMY;  Surgeon: Ward, Elenora Fender, MD;  Location: [facility];  Service: Gynecology;  Laterality: N/A;   LAPAROSCOPY N/A 05/01/2021   Procedure:  LAPAROSCOPY DIAGNOSTIC WITH LYSIS OF ADHESIONS;  Surgeon: [name], MD;  Location: [facility];  Service: Gynecology;  Laterality: N/A;   TRACHELECTOMY N/A 05/01/2021   Procedure: VAGINAL TRACHELECTOMY;  Surgeon: [name], MD;  Location: [facility];  Service: Gynecology;  Laterality: N/A;   WISDOM TOOTH EXTRACTION       Family History  Problem Relation Age of Onset   Ovarian cancer Paternal Grandmother   No bleeding or clotting disorders No aneurysms   Social History   Tobacco Use   Smoking status: Former    Current packs/day: 0.00    Types: Cigarettes    Quit date: 06/22/2019    Years since quitting: 4.2   Smokeless tobacco: Never  Vaping Use   Vaping status: Every Day   Start date: 06/22/2019   Substances: Nicotine, Flavoring  Substance Use Topics   Alcohol use: Yes    Comment: occassional beer   Drug use: No     Allergies  Allergen Reactions   Prevacid [Lansoprazole] Hives    Current Outpatient Medications  Medication Sig Dispense Refill   cetirizine (ZYRTEC) 10 MG chewable tablet Chew 10 mg by mouth as needed for allergies.     ibuprofen (ADVIL) 200 MG tablet Take 200 mg by mouth every 6 (six) hours as needed.     phentermine (ADIPEX-P) 37.5 MG tablet Take 37.5 mg by mouth every morning.     urea (  CARMOL) 40 % CREA Apply topically.     acyclovir ointment (ZOVIRAX) 5 % Apply 1 Application topically as needed (For sunburned lips). (Patient not taking: Reported on 09/20/2023)     BIOTIN PO Take 1 tablet by mouth daily. (Patient not taking: Reported on 09/20/2023)     buPROPion (WELLBUTRIN XL) 300 MG 24 hr tablet Take 300 mg by mouth every morning. (Patient not taking: Reported on 09/20/2023)     Cyanocobalamin (VITAMIN B-12 PO) Take 1 tablet by mouth daily at 6 (six) AM. (Patient not taking: Reported on 09/20/2023)     omeprazole (PRILOSEC OTC) 20 MG tablet Take 20 mg by mouth as needed. (Patient not taking: Reported on 09/20/2023)     oxyCODONE-acetaminophen  (PERCOCET) 5-325 MG tablet Take 1 tablet by mouth every 8 (eight) hours as needed for severe pain. (Patient not taking: Reported on 09/20/2023) 6 tablet 0   No current facility-administered medications for this visit.      REVIEW OF SYSTEMS (Negative unless checked)  Constitutional: [] Weight loss  [] Fever  [] Chills Cardiac: [] Chest pain   [] Chest pressure   [] Palpitations   [] Shortness of breath when laying flat   [] Shortness of breath at rest   [] Shortness of breath with exertion. Vascular:  [] Pain in legs with walking   [] Pain in legs at rest   [] Pain in legs when laying flat   [] Claudication   [] Pain in feet when walking  [] Pain in feet at rest  [] Pain in feet when laying flat   [] History of DVT   [] Phlebitis   [] Swelling in legs   [] Varicose veins   [] Non-healing ulcers Pulmonary:   [] Uses home oxygen   [] Productive cough   [] Hemoptysis   [] Wheeze  [] COPD   [] Asthma Neurologic:  [] Dizziness  [] Blackouts   [] Seizures   [] History of stroke   [] History of TIA  [] Aphasia   [] Temporary blindness   [] Dysphagia   [] Weakness or numbness in arms   [] Weakness or numbness in legs Musculoskeletal:  [] Arthritis   [] Joint swelling   [] Joint pain   [] Low back pain Hematologic:  [] Easy bruising  [] Easy bleeding   [] Hypercoagulable state   [x] Anemic  [] Hepatitis Gastrointestinal:  [] Blood in stool   [] Vomiting blood  [] Gastroesophageal reflux/heartburn   [] Abdominal pain Genitourinary:  [] Chronic kidney disease   [] Difficult urination  [] Frequent urination  [] Burning with urination   [] Hematuria Skin:  [] Rashes   [] Ulcers   [] Wounds Psychological:  [x] History of anxiety   [x]  History of major depression.    Physical Exam BP 121/85   Pulse 87   Resp 18   Ht 5\' 3"  (1.6 m)   Wt 165 lb (74.8 kg)   LMP 02/21/2019 (Exact Date)   BMI 29.23 kg/m  Gen:  WD/WN, NAD Head: [place]/AT, No temporalis wasting. Ear/Nose/Throat: Hearing grossly intact, nares w/o erythema or drainage, oropharynx w/o  Erythema/Exudate Eyes: Conjunctiva clear, sclera non-icteric  Neck: trachea midline.  No JVD.  Pulmonary:  Good air movement, respirations not labored, no use of accessory muscles  Cardiac: RRR, no JVD Vascular:  Vessel Right Left  Radial Palpable Palpable                          DP Palpable Palpable   PT Palpable  Palpable    Gastrointestinal:. No masses, surgical incisions, or scars. Musculoskeletal: M/S 5/5 throughout.  Extremities without ischemic changes.  No deformity or atrophy. No edema. Neurologic: Sensation grossly intact in extremities.  Symmetrical.  Speech is fluent. Motor exam as listed above. Psychiatric: Judgment intact, Mood & affect appropriate for pt's clinical situation. Dermatologic: No rashes or ulcers noted.  No cellulitis or open wounds.    Radiology No results found.  Labs No results found for this or any previous visit (from the past 2160 hours).  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  At this point, we are going to do arterial duplex of the upper and lower extremities to ensure that there is not underlying significant vascular disease.  She does have a tobacco history.  Following the studies, we can discuss whether or not to add a calcium channel blocker.  We discussed the importance of cold avoidance and using gloves and socks to keep her hands and feet warm.  We discussed that this can be uncomfortable but without tissue loss this is not limb threatening at this point.  We discussed the pathophysiology and natural history of Raynaud's disease with the patient who is very familiar with.  She will follow-up after her noninvasive studies.      Festus Barren 09/20/2023, 10:33 AM   This note was created with Dragon medical transcription system.  Any errors from dictation are unintentional.

## 2023-11-08 ENCOUNTER — Encounter (INDEPENDENT_AMBULATORY_CARE_PROVIDER_SITE_OTHER): Payer: Self-pay

## 2023-11-11 ENCOUNTER — Ambulatory Visit (INDEPENDENT_AMBULATORY_CARE_PROVIDER_SITE_OTHER): Admitting: Vascular Surgery

## 2023-11-11 ENCOUNTER — Other Ambulatory Visit (INDEPENDENT_AMBULATORY_CARE_PROVIDER_SITE_OTHER)

## 2023-11-11 ENCOUNTER — Ambulatory Visit (INDEPENDENT_AMBULATORY_CARE_PROVIDER_SITE_OTHER)

## 2023-11-11 ENCOUNTER — Encounter (INDEPENDENT_AMBULATORY_CARE_PROVIDER_SITE_OTHER): Payer: Self-pay | Admitting: Vascular Surgery

## 2023-11-11 VITALS — BP 118/82 | HR 78 | Resp 18 | Ht 63.0 in | Wt 160.0 lb

## 2023-11-11 DIAGNOSIS — I7300 Raynaud's syndrome without gangrene: Secondary | ICD-10-CM

## 2023-11-11 LAB — VAS US ABI WITH/WO TBI
Left ABI: 1.14
Right ABI: 1.17

## 2023-11-11 NOTE — Progress Notes (Signed)
 MRN : [id number]  Sheryl Gross is a 43 y.o. (05-16-1981) female who presents with chief complaint of  Chief Complaint  Patient presents with   Follow-up    Pt conv bil ue and le arterial duplex  .  History of Present Illness: Patient returns today in follow up of her Raynaud's disease.  No major changes since her last visit.  She was studied with noninvasive studies today which showed fairly normal ABIs and digital pressures bilaterally although there was diminishment with cold stimulation.  No significant arterial disease or obstruction   Current Outpatient Medications  Medication Sig Dispense Refill   cetirizine (ZYRTEC) 10 MG chewable tablet Chew 10 mg by mouth as needed for allergies.     ibuprofen  (ADVIL ) 200 MG tablet Take 200 mg by mouth every 6 (six) hours as needed.     phentermine (ADIPEX-P) 37.5 MG tablet Take 37.5 mg by mouth every morning.     urea (CARMOL) 40 % CREA Apply topically.     acyclovir ointment (ZOVIRAX) 5 % Apply 1 Application topically as needed (For sunburned lips). (Patient not taking: Reported on 11/11/2023)     BIOTIN PO Take 1 tablet by mouth daily. (Patient not taking: Reported on 11/11/2023)     buPROPion (WELLBUTRIN XL) 300 MG 24 hr tablet Take 300 mg by mouth every morning. (Patient not taking: Reported on 11/11/2023)     Cyanocobalamin (VITAMIN B-12 PO) Take 1 tablet by mouth daily at 6 (six) AM. (Patient not taking: Reported on 11/11/2023)     omeprazole (PRILOSEC OTC) 20 MG tablet Take 20 mg by mouth as needed. (Patient not taking: Reported on 11/11/2023)     No current facility-administered medications for this visit.    Past Medical History:  Diagnosis Date   Anemia    Anxiety    Biliary colic    Depression    History of kidney stones    Spinal headache     Past Surgical History:  Procedure Laterality Date   CESAREAN SECTION WITH BILATERAL TUBAL LIGATION Bilateral 03/19/2016   Procedure: CESAREAN SECTION WITH BILATERAL TUBAL LIGATION;   Surgeon: Margarie Shay Ward, MD;  Location: [facility];  Service: Obstetrics;  Laterality: Bilateral;  has epidural, anesthesia choice   GANGLION CYST EXCISION Right 2014   right wrist   LAPAROSCOPIC SUPRACERVICAL HYSTERECTOMY N/A 03/07/2019   Procedure: LAPAROSCOPIC SUPRACERVICAL HYSTERECTOMY;  Surgeon: Ward, Margarie Shay, MD;  Location: [facility];  Service: Gynecology;  Laterality: N/A;   LAPAROSCOPY N/A 05/01/2021   Procedure: LAPAROSCOPY DIAGNOSTIC WITH LYSIS OF ADHESIONS;  Surgeon: Prescilla Brod, MD;  Location: [facility];  Service: Gynecology;  Laterality: N/A;   TRACHELECTOMY N/A 05/01/2021   Procedure: VAGINAL TRACHELECTOMY;  Surgeon: Prescilla Brod, MD;  Location: [facility];  Service: Gynecology;  Laterality: N/A;   WISDOM TOOTH EXTRACTION       Social History   Tobacco Use   Smoking status: Former    Current packs/day: 0.00    Types: Cigarettes    Quit date: 06/22/2019    Years since quitting: 4.3   Smokeless tobacco: Never  Vaping Use   Vaping status: Every Day   Start date: 06/22/2019   Substances: Nicotine, Flavoring  Substance Use Topics   Alcohol use: Yes    Comment: occassional beer   Drug use: No      Family History  Problem Relation Age of Onset   Ovarian cancer Paternal Grandmother      Allergies  Allergen Reactions  Prevacid [Lansoprazole] Hives     REVIEW OF SYSTEMS (Negative unless checked)  Constitutional: [] Weight loss  [] Fever  [] Chills Cardiac: [] Chest pain   [] Chest pressure   [] Palpitations   [] Shortness of breath when laying flat   [] Shortness of breath at rest   [] Shortness of breath with exertion. Vascular:  [] Pain in legs with walking   [] Pain in legs at rest   [] Pain in legs when laying flat   [] Claudication   [] Pain in feet when walking  [] Pain in feet at rest  [] Pain in feet when laying flat   [] History of DVT   [] Phlebitis   [] Swelling in legs   [] Varicose veins   [] Non-healing ulcers Pulmonary:   [] Uses home oxygen   [] Productive cough    [] Hemoptysis   [] Wheeze  [] COPD   [] Asthma Neurologic:  [] Dizziness  [] Blackouts   [] Seizures   [] History of stroke   [] History of TIA  [] Aphasia   [] Temporary blindness   [] Dysphagia   [] Weakness or numbness in arms   [] Weakness or numbness in legs Musculoskeletal:  [] Arthritis   [] Joint swelling   [] Joint pain   [] Low back pain Hematologic:  [] Easy bruising  [] Easy bleeding   [] Hypercoagulable state   [x] Anemic   Gastrointestinal:  [] Blood in stool   [] Vomiting blood  [] Gastroesophageal reflux/heartburn   [] Abdominal pain Genitourinary:  [] Chronic kidney disease   [] Difficult urination  [] Frequent urination  [] Burning with urination   [] Hematuria Skin:  [] Rashes   [] Ulcers   [] Wounds Psychological:  [x] History of anxiety   [x]  History of major depression.  Physical Examination  BP 118/82   Pulse 78   Resp 18   Ht 5\' 3"  (1.6 m)   Wt 160 lb (72.6 kg)   LMP 02/21/2019 (Exact Date)   BMI 28.34 kg/m  Gen:  WD/WN, NAD Head: [place]/AT, No temporalis wasting. Ear/Nose/Throat: Hearing grossly intact, nares w/o erythema or drainage Eyes: Conjunctiva clear. Sclera non-icteric Neck: Supple.  Trachea midline Pulmonary:  Good air movement, no use of accessory muscles.  Cardiac: RRR, no JVD Vascular:  Vessel Right Left  Radial Palpable Palpable                          PT Palpable Palpable  DP Palpable Palpable   Musculoskeletal: M/S 5/5 throughout.  No deformity or atrophy. No edema. Neurologic: Sensation grossly intact in extremities.  Symmetrical.  Speech is fluent.  Psychiatric: Judgment intact, Mood & affect appropriate for pt's clinical situation. Dermatologic: No rashes or ulcers noted.  No cellulitis or open wounds.      Labs Recent Results (from the past 2160 hours)  VAS US  ABI WITH/WO TBI     Status: None   Collection Time: 11/11/23  8:57 AM  Result Value Ref Range   Right ABI 1.17    Left ABI 1.14     Radiology VAS US  ABI WITH/WO TBI Result Date: 11/11/2023  LOWER  EXTREMITY DOPPLER STUDY Patient Name:  Sheryl Gross  Date of Exam:   11/11/2023 Medical Rec #: [id number]        Accession #:    [id number] Date of Birth: [birth year]        Patient Gender: F Patient Age:   32 years Exam Location[facility] Procedure:      VAS US  ABI WITH/WO TBI Referring Phys: Mikki Alexander --------------------------------------------------------------------------------   Performing Technologist: Tonie Franks RVS  Examination Guidelines: A complete evaluation includes at minimum,  Doppler waveform signals and systolic blood pressure reading at the level of bilateral brachial, anterior tibial, and posterior tibial arteries, when vessel segments are accessible. Bilateral testing is considered an integral part of a complete examination. Photoelectric Plethysmograph (PPG) waveforms and toe systolic pressure readings are included as required and additional duplex testing as needed. Limited examinations for reoccurring indications may be performed as noted.  ABI Findings: +---------+------------------+-----+---------+--------+ Right    Rt Pressure (mmHg)IndexWaveform Comment  +---------+------------------+-----+---------+--------+ Brachial 125                                      +---------+------------------+-----+---------+--------+ ATA      140               1.11 triphasic         +---------+------------------+-----+---------+--------+ PTA      148               1.17 triphasic         +---------+------------------+-----+---------+--------+ Great Toe90                0.71 Normal            +---------+------------------+-----+---------+--------+ +---------+------------------+-----+---------+-------+ Left     Lt Pressure (mmHg)IndexWaveform Comment +---------+------------------+-----+---------+-------+ Brachial 126                                     +---------+------------------+-----+---------+-------+ ATA      144               1.14 triphasic         +---------+------------------+-----+---------+-------+ PTA      138               1.10 triphasic        +---------+------------------+-----+---------+-------+ Great Toe130               1.03 Normal           +---------+------------------+-----+---------+-------+ +-------+-----------+-----------+------------+------------+ ABI/TBIToday's ABIToday's TBIPrevious ABIPrevious TBI +-------+-----------+-----------+------------+------------+ Right  1.17       .71                                 +-------+-----------+-----------+------------+------------+ Left   1.14       1.03                                +-------+-----------+-----------+------------+------------+ TOES Findings: +----------+---------------+--------+-------+ Right ToesPressure (mmHg)WaveformComment +----------+---------------+--------+-------+ 1st Digit 41             Normal          +----------+---------------+--------+-------+ 2nd Digit 0              Abnormal        +----------+---------------+--------+-------+ 3rd Digit 4              Abnormal        +----------+---------------+--------+-------+ 4th Digit 5              Abnormal        +----------+---------------+--------+-------+ 5th Digit 21             Normal          +----------+---------------+--------+-------+  +---------+---------------+--------+-------+ Left ToesPressure (mmHg)WaveformComment +---------+---------------+--------+-------+ 1st [id number]  Normal          +---------+---------------+--------+-------+ 2nd Digit0              Abnormal        +---------+---------------+--------+-------+ 3rd Digit9              Normal          +---------+---------------+--------+-------+ 4th Digit16             Normal          +---------+---------------+--------+-------+ 5th Digit37             Normal          +---------+---------------+--------+-------+    Summary: Right: Resting right ankle-brachial  index is within normal range. The right toe-brachial index is normal. Upper Extremity Digits were acquired and appear to be Normal. Left: Resting left ankle-brachial index is within normal range. The left toe-brachial index is normal. Upper Extremity Digits were acquired and appear to be Normal.  *See table(s) above for measurements and observations.  Electronically signed by Mikki Alexander MD on 11/11/2023 at 12:33:29 PM.    Final     Assessment/Plan  Raynaud disease without gangrene She was studied with noninvasive studies today which showed fairly normal ABIs and digital pressures bilaterally although there was diminishment with cold stimulation.  No significant arterial disease or obstruction  We discussed the addition of a calcium channel blocker for symptomatic relief if her symptoms are severe enough, but given her blood pressure which runs relatively low anyway, she is leery to start this and I would agree.  Cautioned her to avoid cold stimulation and do her best to continue activity as tolerated.  We will see her back in 1 year but if her symptoms worsen she will contact our office.    Mikki Alexander, MD  11/11/2023 12:40 PM    This note was created with Dragon medical transcription system.  Any errors from dictation are purely unintentional

## 2023-11-11 NOTE — Patient Instructions (Signed)
 Raynaud's Phenomenon  Raynaud's phenomenon is a condition that affects the blood vessels (arteries) that carry blood to the fingers and toes. The arteries that supply blood to the ears, lips, nipples, or the tip of the nose might also be affected. Raynaud's phenomenon causes the arteries to become narrow temporarily (spasm). As a result, the flow of blood to the affected areas is temporarily decreased. This usually occurs in response to cold temperatures or stress. During an attack, the skin in the affected areas turns white, then blue, and finally red. A person may also feel tingling or numbness in those areas. Attacks usually last for only a brief period, and then the blood flow to the area returns to normal. In most cases, Raynaud's phenomenon does not cause serious health problems. What are the causes? In many cases, the cause of this condition is not known. The condition may occur on its own (primary Raynaud's phenomenon) or may be associated with other diseases or factors (secondary Raynaud's phenomenon). Possible causes may include: Diseases or medical conditions that damage the arteries. Injuries and repetitive actions that hurt the hands or feet. Being exposed to certain chemicals. Taking medicines that narrow the arteries. Other medical conditions, such as lupus, scleroderma, rheumatoid arthritis, thyroid problems, blood disorders, Sjogren syndrome, or atherosclerosis. What increases the risk? The following factors may make you more likely to develop this condition: Being 18-78 years old. Being female. Having a family history of Raynaud's phenomenon. Living in a cold climate. Smoking. What are the signs or symptoms? Symptoms of this condition usually occur when you are exposed to cold temperatures or when you have emotional stress. The symptoms may last for a few minutes or up to several hours. They usually affect your fingers but may also affect your toes, nipples, lips, ears, or the  tip of your nose. Symptoms may include: Changes in skin color. The skin in the affected areas will turn pale or white. The skin may then change from white to bluish to red as normal blood flow returns to the area. Numbness, tingling, or pain in the affected areas. In severe cases, symptoms may include: Skin sores. Tissues decaying and dying (gangrene). How is this diagnosed? This condition may be diagnosed based on: Your symptoms and medical history. A physical exam. During the exam, you may be asked to put your hands in cold water to check for a reaction to cold temperature. Tests, such as: Blood tests to check for other diseases or conditions. A test to check the movement of blood through your arteries and veins (vascular ultrasound). A test in which the skin at the base of your fingernail is examined under a microscope (nailfold capillaroscopy). How is this treated? During an episode, you can take actions to help symptoms go away faster. Options include moving your arms around in a windmill pattern, warming your fingers under warm water, or placing your fingers in a warm body fold, such as your armpit. Long-term treatment for this condition often involves making lifestyle changes and taking steps to control your exposure to cold temperature. For more severe cases, medicine (calcium channel blockers) may be used to improve blood circulation. Follow these instructions at home: Avoiding cold temperatures Take these steps to avoid exposure to cold: If possible, stay indoors during cold weather. When you go outside during cold weather, dress in layers and wear mittens, a hat, a scarf, and warm footwear. Wear mittens or gloves when handling ice or frozen food. Use holders for glasses or cans containing  cold drinks. Let warm water run for a while before taking a shower or bath. Warm up the car before driving in cold weather. Lifestyle If possible, avoid stressful and emotional situations. Try  to find ways to manage your stress, such as: Exercise. Yoga. Meditation. Biofeedback. Do not use any products that contain nicotine or tobacco. These products include cigarettes, chewing tobacco, and vaping devices, such as e-cigarettes. If you need help quitting, ask your health care provider. Avoid secondhand smoke. Limit your use of caffeine. Switch to decaffeinated coffee, tea, and soda. Avoid chocolate. Avoid vibrating tools and machinery. General instructions Protect your hands and feet from injuries, cuts, or bruises. Avoid wearing tight rings or wristbands. Wear loose fitting socks and comfortable, roomy shoes. Take over-the-counter and prescription medicines only as told by your health care provider. Where to find support Raynaud's Association: www.raynauds.org Where to find more information [employer] of Arthritis and Musculoskeletal and Skin Diseases: www.niams.http://www.myers.net/ Contact a health care provider if: Your discomfort becomes worse despite lifestyle changes. You develop sores on your fingers or toes that do not heal. You have breaks in the skin on your fingers or toes. You have a fever. You have pain or swelling in your joints. You have a rash. Your symptoms occur on only one side of your body. Get help right away if: Your fingers or toes turn black. You have severe pain in the affected areas. These symptoms may represent a serious problem that is an emergency. Do not wait to see if the symptoms will go away. Get medical help right away. Call your local emergency services (911 in the [place] Do not drive yourself to the hospital. Summary Raynaud's phenomenon is a condition that affects the arteries that carry blood to the fingers, toes, ears, lips, nipples, or the tip of the nose. In many cases, the cause of this condition is not known. Symptoms of this condition include changes in skin color along with numbness and tingling in the affected area. Treatment for  this condition includes lifestyle changes and reducing exposure to cold temperatures. Medicines may be used for severe cases of the condition. Contact your health care provider if your condition worsens despite treatment. This information is not intended to replace advice given to you by your health care provider. Make sure you discuss any questions you have with your health care provider. Document Revised: 08/12/2020 Document Reviewed: 08/12/2020 Elsevier Patient Education  2024 [employer].

## 2023-11-11 NOTE — Assessment & Plan Note (Signed)
 She was studied with noninvasive studies today which showed fairly normal ABIs and digital pressures bilaterally although there was diminishment with cold stimulation.  No significant arterial disease or obstruction  We discussed the addition of a calcium channel blocker for symptomatic relief if her symptoms are severe enough, but given her blood pressure which runs relatively low anyway, she is leery to start this and I would agree.  Cautioned her to avoid cold stimulation and do her best to continue activity as tolerated.  We will see her back in 1 year but if her symptoms worsen she will contact our office.

## 2024-05-14 ENCOUNTER — Ambulatory Visit: Admission: EM | Admit: 2024-05-14 | Discharge: 2024-05-14 | Disposition: A | Discharge status: Home / Self Care

## 2024-05-14 DIAGNOSIS — T8090XA Unspecified complication following infusion and therapeutic injection, initial encounter: Secondary | ICD-10-CM

## 2024-05-14 DIAGNOSIS — L03115 Cellulitis of right lower limb: Secondary | ICD-10-CM

## 2024-05-14 HISTORY — DX: Ankylosing spondylitis of unspecified sites in spine: M45.9

## 2024-05-14 MED ORDER — CEPHALEXIN 500 MG PO CAPS: 500.0000 mg | ORAL_CAPSULE | Freq: Four times a day (QID) | ORAL | 0 refills | Status: AC

## 2024-05-14 NOTE — ED Provider Notes (Signed)
 Sheryl Gross    CSN: [id number] Arrival date & time: 05/14/24  0808      History   Chief Complaint Chief Complaint  Patient presents with   Wound Check    HPI Sheryl Gross is a 43 y.o. female.  Patient presents with warmth, redness, swelling at an injection site on her right anterior thigh.  Her symptoms started 4 days after self administering subcutaneous Enbrel autoinjector.  She gave herself the injection on 05/08/2024 and her symptoms started on 05/12/2024.  Initially the area had patchy redness.  The acute redness has faded but the area is warm and swollen and mildly red.  The area of swelling and warmth has gotten larger.  No fever, chills, purulent drainage.  The history is provided by the patient and medical records.    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    Past Surgical History:  Procedure Laterality Date   CESAREAN SECTION WITH BILATERAL TUBAL LIGATION Bilateral 03/19/2016   Procedure: CESAREAN SECTION WITH BILATERAL TUBAL LIGATION;  Surgeon: Mitzie BROCKS Ward, MD;  Location: [facility];  Service: Obstetrics;  Laterality: Bilateral;  has epidural, anesthesia choice   GANGLION CYST EXCISION Right 2014   right wrist   LAPAROSCOPIC SUPRACERVICAL HYSTERECTOMY N/A 03/07/2019   Procedure: LAPAROSCOPIC SUPRACERVICAL HYSTERECTOMY;  Surgeon: Ward, Mitzie BROCKS, MD;  Location: [facility];  Service: Gynecology;  Laterality: N/A;   LAPAROSCOPY N/A 05/01/2021   Procedure: LAPAROSCOPY DIAGNOSTIC WITH LYSIS OF ADHESIONS;  Surgeon: Verdon Keen, MD;  Location: [facility];  Service: Gynecology;  Laterality: N/A;   TRACHELECTOMY N/A 05/01/2021    Procedure: VAGINAL TRACHELECTOMY;  Surgeon: Verdon Keen, MD;  Location: [facility];  Service: Gynecology;  Laterality: N/A;   WISDOM TOOTH EXTRACTION      OB History     Gravida  4   Para  1   Term  1   Preterm      AB  1   Living  3      SAB  1   IAB      Ectopic      Multiple  0   Live Births  1            Home Medications    Prior to Admission medications   Medication Sig Start Date End Date Taking? Authorizing Provider  cephALEXin  (KEFLEX ) 500 MG capsule Take 1 capsule (500 mg total) by mouth 4 (four) times daily. 05/14/24  Yes Corlis Burnard DEL, NP  acyclovir ointment (ZOVIRAX) 5 % Apply 1 Application topically as needed (For sunburned lips). Patient not taking: Reported on 11/11/2023    [provider]  BIOTIN PO Take 1 tablet by mouth daily. Patient not taking: Reported on 11/11/2023    [provider]  buPROPion (WELLBUTRIN XL) 300 MG 24 hr tablet Take 300 mg by mouth every morning. Patient not taking: Reported on 11/11/2023    [provider]  cetirizine (ZYRTEC) 10 MG chewable tablet Chew 10 mg by mouth as needed for allergies.    [provider]  Cyanocobalamin (VITAMIN B-12 PO) Take 1  tablet by mouth daily at 6 (six) AM. Patient not taking: Reported on 11/11/2023    [provider]  ENBREL SURECLICK 50 MG/ML injection Inject into the skin.    [provider]  ibuprofen  (ADVIL ) 200 MG tablet Take 200 mg by mouth every 6 (six) hours as needed.    [provider]  omeprazole (PRILOSEC OTC) 20 MG tablet Take 20 mg by mouth as needed. Patient not taking: Reported on 11/11/2023    [provider]  phentermine (ADIPEX-P) 37.5 MG tablet Take 37.5 mg by mouth every morning. Patient not taking: Reported on 05/14/2024 04/02/21   [provider]  urea (CARMOL) 40 % CREA Apply topically. 09/06/23 09/05/24  [provider]    Family History Family History  Problem Relation Age  of Onset   Ovarian cancer Paternal Grandmother     Social History Social History   Tobacco Use   Smoking status: Former    Current packs/day: 0.00    Types: Cigarettes    Quit date: 06/22/2019    Years since quitting: 4.8   Smokeless tobacco: Never  Vaping Use   Vaping status: Every Day   Start date: 06/22/2019   Substances: Nicotine, Flavoring  Substance Use Topics   Alcohol use: Yes    Comment: occassional beer   Drug use: No     Allergies   Prevacid [lansoprazole]   Review of Systems Review of Systems  Constitutional:  Negative for chills and fever.  Musculoskeletal:  Negative for arthralgias, gait problem and joint swelling.  Skin:  Positive for color change and rash.  Neurological:  Negative for weakness and numbness.     Physical Exam Triage Vital Signs ED Triage Vitals  Encounter Vitals Group     BP      Girls Systolic BP Percentile      Girls Diastolic BP Percentile      Boys Systolic BP Percentile      Boys Diastolic BP Percentile      Pulse      Resp      Temp      Temp src      SpO2      Weight      Height      Head Circumference      Peak Flow      Pain Score      Pain Loc      Pain Education      Exclude from Growth Chart    No data found.  Updated Vital Signs BP 139/86   Pulse 85   Temp 97.8 F (36.6 C)   Resp 18   LMP 02/21/2019 (Exact Date)   SpO2 96%   Visual Acuity Right Eye Distance:   Left Eye Distance:   Bilateral Distance:    Right Eye Near:   Left Eye Near:    Bilateral Near:     Physical Exam Constitutional:      General: She is not in acute distress. HENT:     Mouth/Throat:     Mouth: Mucous membranes are moist.  Cardiovascular:     Rate and Rhythm: Normal rate.  Pulmonary:     Effort: Pulmonary effort is normal. No respiratory distress.  Skin:    General: Skin is warm and dry.     Findings: Erythema present.     Comments: Approximately 8 cm circular area of slightly erythematous, warm, mildly edematous  on right anterior thigh.  No open wounds or drainage.  Neurological:     Mental Status: She is alert.      UC Treatments / Results  Labs (all labs ordered are listed, but only abnormal results are displayed) Labs Reviewed - No data to display  EKG   Radiology No results found.  Procedures Procedures (including critical care time)  Medications Ordered in UC Medications - No data to display  Initial Impression / Assessment and Plan / UC Course  I have reviewed the triage vital signs and the nursing notes.  Pertinent labs & imaging results that were available during my care of the patient were reviewed by me and considered in my medical decision making (see chart for details).    Injection site reaction and cellulitis of right anterior thigh.  Afebrile and vital signs are stable.  Treating today with cephalexin .  Instructed patient to follow-up with her PCP or rheumatologist today or tomorrow.  ED precautions given.  Education provided on cellulitis.  Patient agrees to plan of care.  Final Clinical Impressions(s) / UC Diagnoses   Final diagnoses:  Cellulitis of right thigh  Injection site reaction, initial encounter     Discharge Instructions      Follow up with your primary care provider tomorrow.  Go to the emergency department if you have worsening symptoms.    Take the cephalexin  as directed.     ED Prescriptions     Medication Sig Dispense Auth. Provider   cephALEXin  (KEFLEX ) 500 MG capsule Take 1 capsule (500 mg total) by mouth 4 (four) times daily. 28 capsule Corlis Burnard DEL, NP      PDMP not reviewed this encounter.   Corlis Burnard DEL, NP 05/14/24 [phone]

## 2024-05-14 NOTE — Discharge Instructions (Addendum)
 Follow up with your primary care provider tomorrow.  Go to the emergency department if you have worsening symptoms.    Take the cephalexin  as directed.

## 2024-05-14 NOTE — ED Triage Notes (Signed)
 Patient to Urgent Care with complaints of a possible reaction to a subcutaneous injection. Right thigh. No drainage/ fevers.   Administered enbrel 6 days ago. Reports four days later the area became large/ red/ inflamed.

## 2024-07-12 ENCOUNTER — Ambulatory Visit: Admitting: Podiatry

## 2024-07-12 DIAGNOSIS — L600 Ingrowing nail: Secondary | ICD-10-CM | POA: Diagnosis not present

## 2024-07-12 MED ORDER — DOXYCYCLINE HYCLATE 100 MG PO TABS: 100.0000 mg | ORAL_TABLET | Freq: Two times a day (BID) | ORAL | 0 refills | Status: AC

## 2024-07-12 NOTE — Progress Notes (Signed)
 "  Subjective:  Patient ID: Sheryl Gross, female    DOB: 12/08/1980,  MRN: [id number]  Chief Complaint  Patient presents with   Ingrown Toenail    44 y.o. female presents with the above complaint.  Patient presents with left hallux medial border ingrown painful to touch has progressively got worse worse with ambulation for pressure she would like to have it removed she has not seen and was prior to seeing me denies any other acute complaints.  Some erythema present not currently on antibiotics   Review of Systems: Negative except as noted in the HPI. Denies N/V/F/Ch.  Past Medical History:  Diagnosis Date   Anemia    Ankylosing spondylitis (HCC)    Anxiety    Biliary colic    Depression    History of kidney stones    Spinal headache    Current Medications[1]  Tobacco Use History[2]  Allergies[3] Objective:  There were no vitals filed for this visit. There is no height or weight on file to calculate BMI. Constitutional Well developed. Well nourished.  Vascular Dorsalis pedis pulses palpable bilaterally. Posterior tibial pulses palpable bilaterally. Capillary refill normal to all digits.  No cyanosis or clubbing noted. Pedal hair growth normal.  Neurologic Normal speech. Oriented to person, place, and time. Epicritic sensation to light touch grossly present bilaterally.  Dermatologic Painful ingrowing nail at medial nail borders of the hallux nail left. No other open wounds. No skin lesions.  Orthopedic: Normal joint ROM without pain or crepitus bilaterally. No visible deformities. No bony tenderness.   Radiographs: None Assessment:   1. Ingrown left big toenail    Plan:  Patient was evaluated and treated and all questions answered.  Ingrown Nail, left -Patient elects to proceed with minor surgery to remove ingrown toenail removal today. Consent reviewed and signed by patient. -Ingrown nail excised. See procedure note. -Educated on post-procedure care  including soaking. Written instructions provided and reviewed. -Patient to follow up in 2 weeks for nail check if needed - Doxycycline  was dispensed for skin and soft tissue prophylaxis  Procedure: Excision of Ingrown Toenail Location: Left 1st toe medial nail borders. Anesthesia: Lidocaine  1% plain; 1.5 mL and Marcaine  0.5% plain; 1.5 mL, digital block. Skin Prep: Betadine . Dressing: Silvadene; telfa; dry, sterile, compression dressing. Technique: Following skin prep, the toe was exsanguinated and a tourniquet was secured at the base of the toe. The affected nail border was freed, split with a nail splitter, and excised. Chemical matrixectomy was then performed with phenol and irrigated out with alcohol. The tourniquet was then removed and sterile dressing applied. Disposition: Patient tolerated procedure well. Patient to return in 2 weeks for follow-up.   No follow-ups on file.    [1]  Current Outpatient Medications:    doxycycline  (VIBRA -TABS) 100 MG tablet, Take 1 tablet (100 mg total) by mouth 2 (two) times daily., Disp: 20 tablet, Rfl: 0   acyclovir ointment (ZOVIRAX) 5 %, Apply 1 Application topically as needed (For sunburned lips). (Patient not taking: Reported on 11/11/2023), Disp: , Rfl:    BIOTIN PO, Take 1 tablet by mouth daily. (Patient not taking: Reported on 11/11/2023), Disp: , Rfl:    buPROPion (WELLBUTRIN XL) 300 MG 24 hr tablet, Take 300 mg by mouth every morning. (Patient not taking: Reported on 11/11/2023), Disp: , Rfl:    cephALEXin  (KEFLEX ) 500 MG capsule, Take 1 capsule (500 mg total) by mouth 4 (four) times daily., Disp: 28 capsule, Rfl: 0   cetirizine (ZYRTEC) 10 MG  chewable tablet, Chew 10 mg by mouth as needed for allergies., Disp: , Rfl:    Cyanocobalamin (VITAMIN B-12 PO), Take 1 tablet by mouth daily at 6 (six) AM. (Patient not taking: Reported on 11/11/2023), Disp: , Rfl:    ENBREL SURECLICK 50 MG/ML injection, Inject into the skin., Disp: , Rfl:    ibuprofen   (ADVIL ) 200 MG tablet, Take 200 mg by mouth every 6 (six) hours as needed., Disp: , Rfl:    omeprazole (PRILOSEC OTC) 20 MG tablet, Take 20 mg by mouth as needed. (Patient not taking: Reported on 11/11/2023), Disp: , Rfl:    phentermine (ADIPEX-P) 37.5 MG tablet, Take 37.5 mg by mouth every morning. (Patient not taking: Reported on 05/14/2024), Disp: , Rfl:    urea (CARMOL) 40 % CREA, Apply topically., Disp: , Rfl:  [2]  Social History Tobacco Use  Smoking Status Former   Current packs/day: 0.00   Average packs/day: 0.5 packs/day   Types: Cigarettes   Quit date: 06/22/2019   Years since quitting: 5.0  Smokeless Tobacco Never  [3]  Allergies Allergen Reactions   Prevacid [Lansoprazole] Hives   "

## 2024-11-09 ENCOUNTER — Ambulatory Visit (INDEPENDENT_AMBULATORY_CARE_PROVIDER_SITE_OTHER): Admitting: Vascular Surgery
